# Patient Record
Sex: Male | Born: 1935 | Race: Black or African American | Hispanic: No | Marital: Married | Smoking: Former smoker
Health system: Southern US, Community
[De-identification: ages and names within clinical notes are randomized; demographics above are authoritative.]

## PROBLEM LIST (undated history)

## (undated) DIAGNOSIS — E039 Hypothyroidism, unspecified: Secondary | ICD-10-CM

## (undated) DIAGNOSIS — I10 Essential (primary) hypertension: Secondary | ICD-10-CM

## (undated) DIAGNOSIS — N189 Chronic kidney disease, unspecified: Secondary | ICD-10-CM

## (undated) DIAGNOSIS — E119 Type 2 diabetes mellitus without complications: Secondary | ICD-10-CM

## (undated) DIAGNOSIS — I509 Heart failure, unspecified: Secondary | ICD-10-CM

## (undated) HISTORY — PX: CHOLECYSTECTOMY: SHX55

## (undated) HISTORY — PX: CORONARY ARTERY BYPASS GRAFT: SHX141

## (undated) HISTORY — DX: Heart failure, unspecified: I50.9

## (undated) HISTORY — DX: Chronic kidney disease, unspecified: N18.9

## (undated) HISTORY — DX: Type 2 diabetes mellitus without complications: E11.9

## (undated) HISTORY — DX: Essential (primary) hypertension: I10

## (undated) HISTORY — DX: Hypothyroidism, unspecified: E03.9

---

## 2003-08-26 ENCOUNTER — Ambulatory Visit (HOSPITAL_COMMUNITY): Admission: RE | Admit: 2003-08-26 | Discharge: 2003-08-26 | Payer: Self-pay | Admitting: Ophthalmology

## 2005-04-23 IMAGING — CR DG CHEST 2V
2 series · 2 of 2 positions shown · non-contrast
Comparison: none

CLINICAL DATA: 70-year-old male ? preoperative respiratory evaluation for retinal detachment.  History of hypertension and CABG.  
 CHEST (TWO VIEWS) 08/26/03 AT 8888 HOURS

[view not recorded (1 of 2)]
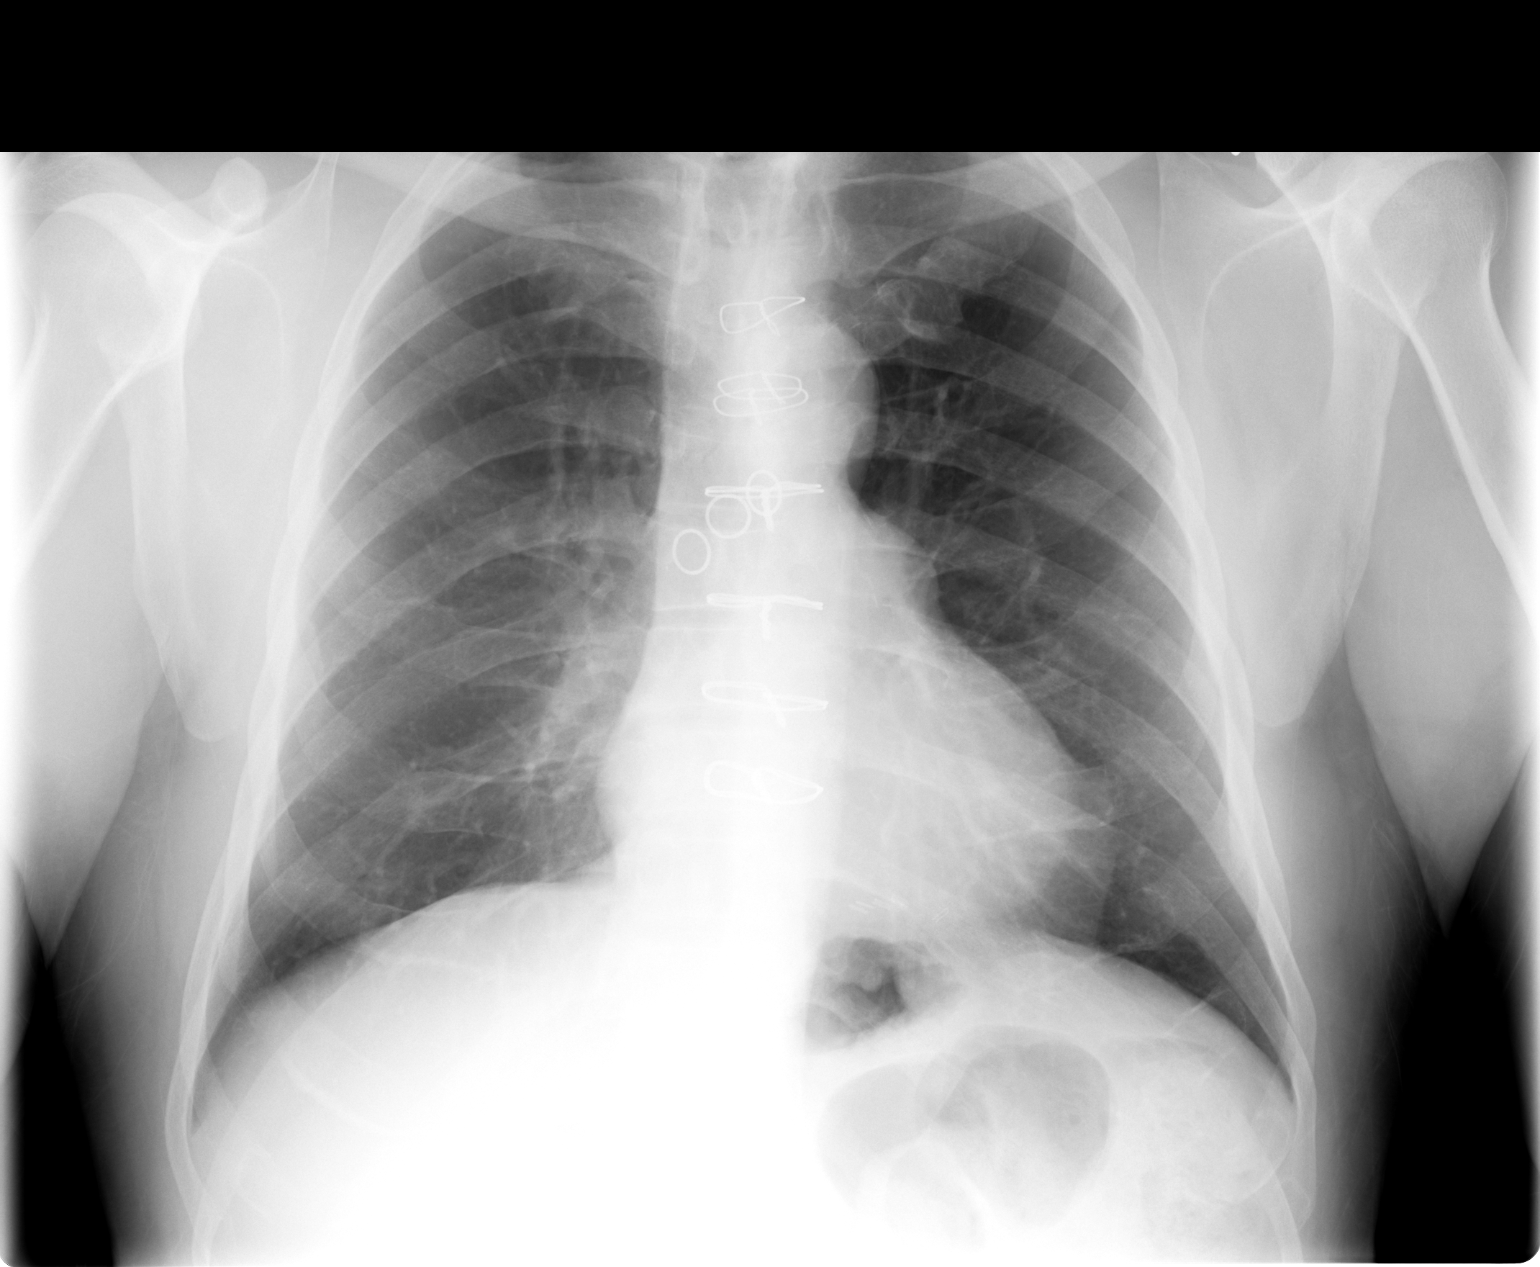

[view not recorded (2 of 2)]
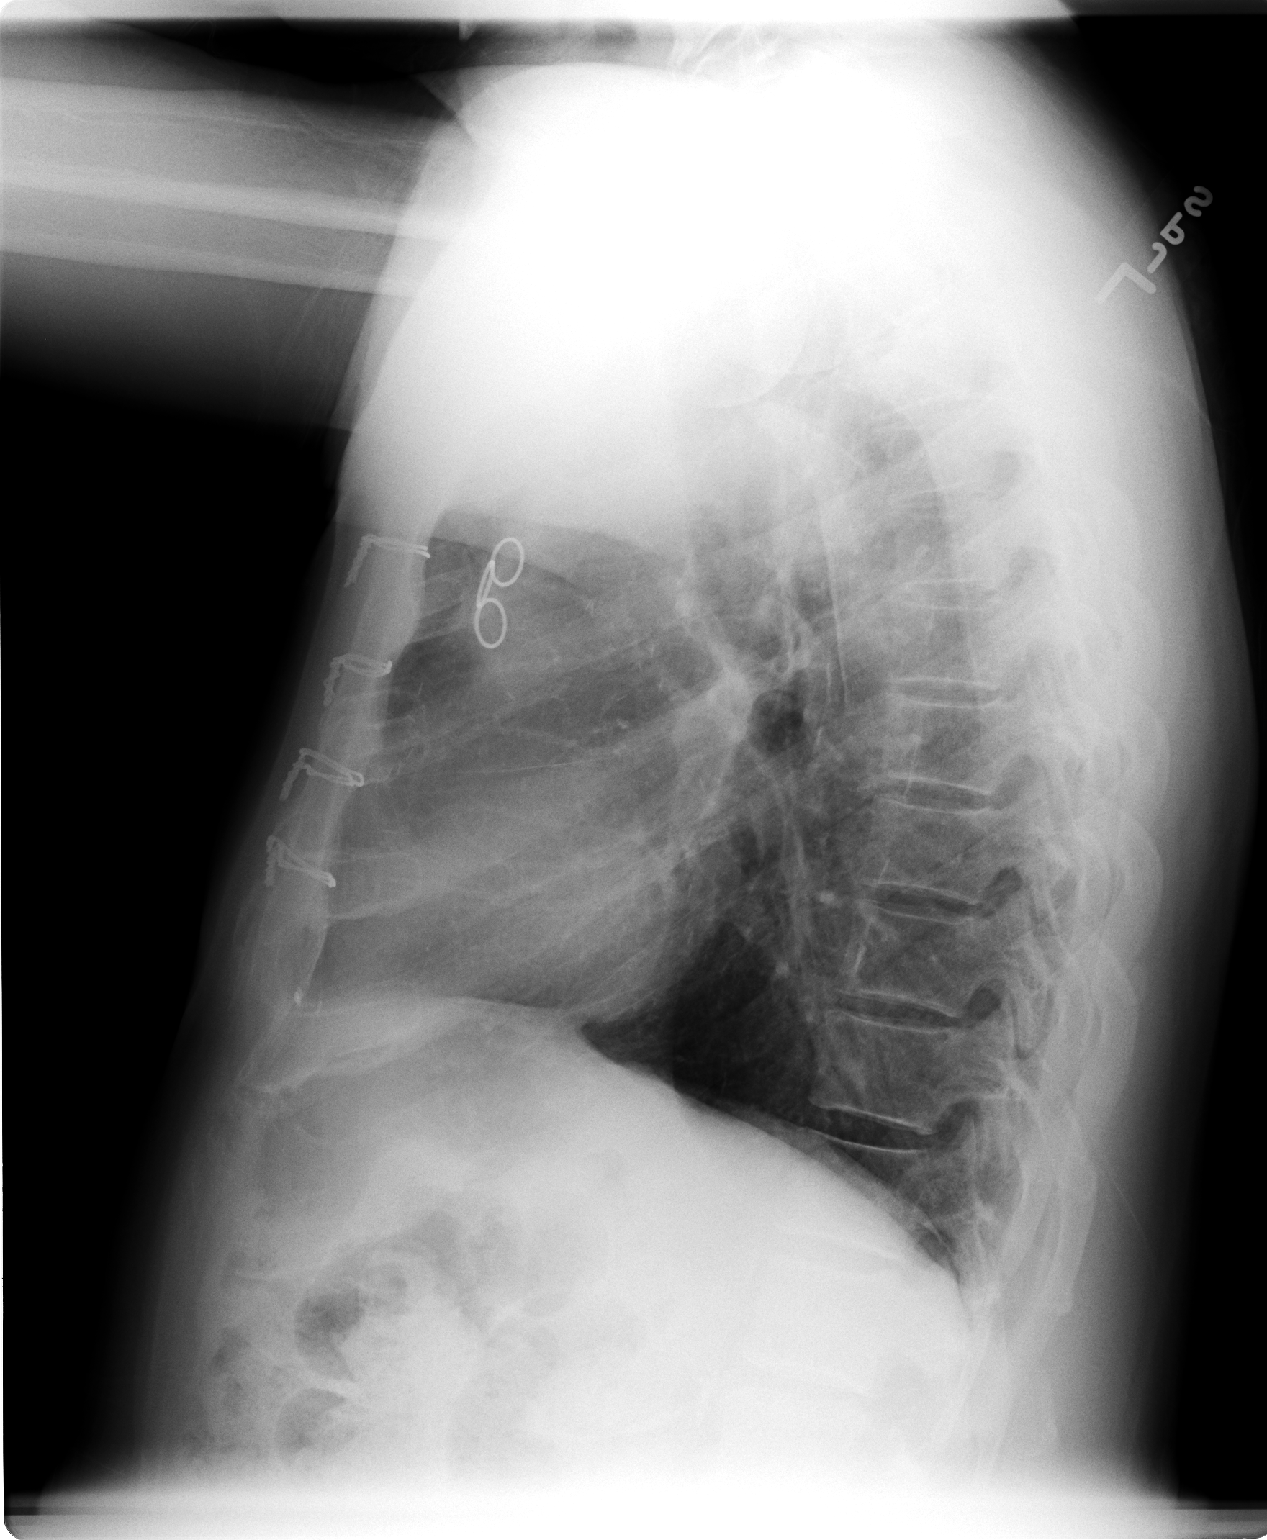

[2 of 2 positions shown; findings below may reference images not displayed]

FINDINGS: Heart size is normal.  There is prominence of the pulmonary outflow tract.  Patient has had a median sternotomy for CABG.  No active airspace disease.  Lungs are clear.  No effusion or pneumothorax.  
 IMPRESSION
 Status post CABG.  No active disease.

## 2015-01-23 ENCOUNTER — Ambulatory Visit: Payer: Self-pay | Admitting: "Endocrinology

## 2015-04-11 ENCOUNTER — Encounter: Payer: Self-pay | Admitting: "Endocrinology

## 2015-04-18 LAB — HEMOGLOBIN A1C: HEMOGLOBIN A1C: 7

## 2015-04-28 ENCOUNTER — Ambulatory Visit: Payer: Self-pay | Admitting: "Endocrinology

## 2015-05-19 ENCOUNTER — Ambulatory Visit (INDEPENDENT_AMBULATORY_CARE_PROVIDER_SITE_OTHER): Payer: Medicare Other | Admitting: "Endocrinology

## 2015-05-19 ENCOUNTER — Encounter: Payer: Self-pay | Admitting: "Endocrinology

## 2015-05-19 VITALS — BP 146/72 | HR 81 | Ht 66.0 in | Wt 147.0 lb

## 2015-05-19 DIAGNOSIS — Z992 Dependence on renal dialysis: Secondary | ICD-10-CM | POA: Diagnosis not present

## 2015-05-19 DIAGNOSIS — E785 Hyperlipidemia, unspecified: Secondary | ICD-10-CM

## 2015-05-19 DIAGNOSIS — I10 Essential (primary) hypertension: Secondary | ICD-10-CM | POA: Insufficient documentation

## 2015-05-19 DIAGNOSIS — E038 Other specified hypothyroidism: Secondary | ICD-10-CM | POA: Diagnosis not present

## 2015-05-19 DIAGNOSIS — E782 Mixed hyperlipidemia: Secondary | ICD-10-CM | POA: Insufficient documentation

## 2015-05-19 DIAGNOSIS — E1122 Type 2 diabetes mellitus with diabetic chronic kidney disease: Secondary | ICD-10-CM

## 2015-05-19 DIAGNOSIS — N186 End stage renal disease: Secondary | ICD-10-CM

## 2015-05-19 NOTE — Progress Notes (Signed)
Subjective:    Patient ID: Evan Carroll, male    DOB: 1934/01/04, PCP Olen Cordial, MD   Past Medical History  Diagnosis Date  . Hypothyroidism   . Diabetes mellitus, type II (HCC)   . CHF (congestive heart failure) (HCC)   . Hypertension   . CKD (chronic kidney disease)    Past Surgical History  Procedure Laterality Date  . Coronary artery bypass graft    . Cholecystectomy     Social History   Social History  . Marital Status: Married    Spouse Name: N/A  . Number of Children: N/A  . Years of Education: N/A   Social History Main Topics  . Smoking status: Former Games developer  . Smokeless tobacco: None  . Alcohol Use: No  . Drug Use: No  . Sexual Activity: Not Asked   Other Topics Concern  . None   Social History Narrative  . None   Outpatient Encounter Prescriptions as of 05/19/2015  Medication Sig  . amLODipine (NORVASC) 5 MG tablet Take 5 mg by mouth daily.  Marland Kitchen atorvastatin (LIPITOR) 20 MG tablet Take 20 mg by mouth daily.  . famotidine (PEPCID) 20 MG tablet Take 20 mg by mouth daily.  . hydrALAZINE (APRESOLINE) 50 MG tablet Take 50 mg by mouth 3 (three) times daily.  . Insulin Glargine (LANTUS SOLOSTAR) 100 UNIT/ML Solostar Pen Inject 10 Units into the skin every morning.  . labetalol (NORMODYNE) 200 MG tablet Take 200 mg by mouth once.  Marland Kitchen levothyroxine (SYNTHROID, LEVOTHROID) 75 MCG tablet Take 75 mcg by mouth daily before breakfast.  . nitroGLYCERIN (NITRODUR - DOSED IN MG/24 HR) 0.4 mg/hr patch Place 0.4 mg onto the skin daily.  . Prenat-FeFum-FePo-FA-Omega 3 (SE-TAN DHA PO) Take by mouth.  . sitaGLIPtin (JANUVIA) 25 MG tablet Take 25 mg by mouth daily.  . Vitamin D, Ergocalciferol, (DRISDOL) 50000 units CAPS capsule Take 50,000 Units by mouth every 7 (seven) days.  Marland Kitchen warfarin (COUMADIN) 7.5 MG tablet Take 7.5 mg by mouth daily.   No facility-administered encounter medications on file as of 05/19/2015.   ALLERGIES: No Known Allergies VACCINATION  STATUS:  There is no immunization history on file for this patient.  Diabetes He presents for his follow-up diabetic visit. He has type 2 diabetes mellitus. Onset time: He was diagnosed at approximate age of 52 years. His disease course has been improving. There are no hypoglycemic associated symptoms. Pertinent negatives for hypoglycemia include no confusion, headaches, pallor or seizures. There are no diabetic associated symptoms. Pertinent negatives for diabetes include no chest pain, no fatigue, no polydipsia, no polyphagia, no polyuria and no weakness. Symptoms are improving. Diabetic complications include heart disease, nephropathy and peripheral neuropathy. Risk factors for coronary artery disease include diabetes mellitus, dyslipidemia, hypertension, male sex, sedentary lifestyle and tobacco exposure. Current diabetic treatment includes insulin injections. He is compliant with treatment most of the time (He has a course of uncontrolled diabetes for several years in the past.). His weight is decreasing steadily. He is following a generally unhealthy diet. He never participates in exercise. His home blood glucose trend is decreasing steadily (He monitors once a day in the morning blood glucose readings or between 101 165.). His overall blood glucose range is 140-180 mg/dl.  Thyroid Problem Presents for follow-up visit. Patient reports no constipation, diarrhea, fatigue or palpitations. The symptoms have been improving. Past treatments include levothyroxine. His past medical history is significant for hyperlipidemia.  Hyperlipidemia This is a chronic problem. The current  episode started more than 1 year ago. Pertinent negatives include no chest pain, myalgias or shortness of breath. Current antihyperlipidemic treatment includes statins. Risk factors for coronary artery disease include diabetes mellitus, dyslipidemia, male sex and a sedentary lifestyle.  Hypertension This is a chronic problem. The  current episode started more than 1 year ago. Pertinent negatives include no chest pain, headaches, neck pain, palpitations or shortness of breath. Risk factors for coronary artery disease include dyslipidemia, diabetes mellitus, male gender and smoking/tobacco exposure. Hypertensive end-organ damage includes a thyroid problem.     Review of Systems  Constitutional: Negative for fever, chills, fatigue and unexpected weight change.  HENT: Negative for dental problem, mouth sores and trouble swallowing.   Eyes: Negative for visual disturbance.  Respiratory: Negative for cough, choking, chest tightness, shortness of breath and wheezing.   Cardiovascular: Negative for chest pain, palpitations and leg swelling.  Gastrointestinal: Negative for nausea, vomiting, abdominal pain, diarrhea, constipation and abdominal distention.  Endocrine: Negative for polydipsia, polyphagia and polyuria.  Genitourinary: Negative for dysuria, urgency, hematuria and flank pain.  Musculoskeletal: Negative for myalgias, back pain, gait problem and neck pain.  Skin: Negative for pallor, rash and wound.  Neurological: Negative for seizures, syncope, weakness, numbness and headaches.  Psychiatric/Behavioral: Negative.  Negative for confusion and dysphoric mood.    Objective:    BP 146/72 mmHg  Pulse 81  Ht  (1.676 m)  Wt 147 lb (66.679 kg)  BMI 23.74 kg/m2  SpO2 98%  Wt Readings from Last 3 Encounters:  05/19/15 147 lb (66.679 kg)    Physical Exam  Constitutional: He is oriented to person, place, and time. He appears well-developed and well-nourished. He is cooperative. No distress.  HENT:  Head: Normocephalic and atraumatic.  Eyes: EOM are normal.  Neck: Normal range of motion. Neck supple. No tracheal deviation present. No thyromegaly present.  Cardiovascular: Normal rate, S1 normal, S2 normal and normal heart sounds.  Exam reveals no gallop.   No murmur heard. Pulses:      Dorsalis pedis pulses are 1+  on the right side, and 1+ on the left side.       Posterior tibial pulses are 1+ on the right side, and 1+ on the left side.  Pulmonary/Chest: Breath sounds normal. No respiratory distress. He has no wheezes.  Abdominal: Soft. Bowel sounds are normal. He exhibits no distension. There is no tenderness. There is no guarding and no CVA tenderness.  Musculoskeletal: He exhibits no edema.       Right shoulder: He exhibits no swelling and no deformity.  Neurological: He is alert and oriented to person, place, and time. He has normal strength and normal reflexes. No cranial nerve deficit or sensory deficit. Gait normal.  Skin: Skin is warm and dry. No rash noted. No cyanosis. Nails show no clubbing.  Psychiatric: He has a normal mood and affect. His speech is normal and behavior is normal. Cognition and memory are normal.     Assessment & Plan:   1.  Type 2 Diabetes mellitus complicated by coronary artery disease and  end-stage renal disease (HCC) Now on hemodialysis  - patient remains at a high risk for more acute and chronic complications of diabetes which include CAD, CVA, CKD, retinopathy, and neuropathy. These are all discussed in detail with the patient.  Patient came with  controlled fasting glucose profile, and  recent A1c of  7 %.   Recent labs reviewed.   - I have re-counseled the patient on  diet management   by adopting a carbohydrate restricted / protein rich  Diet.  - Suggestion is made for patient to avoid simple carbohydrates   from their diet including Cakes , Desserts, Ice Cream,  Soda (  diet and regular) , Sweet Tea , Candies,  Chips, Cookies, Artificial Sweeteners,   and "Sugar-free" Products .  This will help patient to have stable blood glucose profile and potentially avoid unintended  Weight gain.  - Patient is advised to stick to a routine mealtimes to eat 3 meals  a day and avoid unnecessary snacks ( to snack only to correct hypoglycemia).  - The patient  has been   scheduled with Norm Salt, RDN, CDE for individualized DM education.  - I have approached patient with the following individualized plan to manage diabetes and patient agrees.  - I will proceed with basal insulin  Lantus 10 units QAM, associated with strict monitoring of glucose  AC and HS. - He will be reevaluated in one week to see if he needs more or less therapy. -Patient is encouraged to call clinic for blood glucose levels less than 70 or above 300 mg /dl. - I will continue  Januvia 25 mg by mouth every morning, therapeutically suitable for patient. -Patient is not a candidate for  metformin,SGLT2 inhibitors due to CKD.  - He is not a suitable candidate for incretin therapy . - Patient specific target  for A1c; LDL, HDL, Triglycerides, and  Waist Circumference were discussed in detail.  2) BP/HTN: controlled. Continue current medications. 3) Lipids/HPL:  continue statins. 4)  Weight/Diet: CDE consult in progress, exercise, and carbohydrates information provided.  5) hypothyroidism: I advised patient to continue levothyroxine 75 g by mouth every morning.  - We discussed about correct intake of levothyroxine, at fasting, with water, separated by at least 30 minutes from breakfast, and separated by more than 4 hours from calcium, iron, multivitamins, acid reflux medications (PPIs). -Patient is made aware of the fact that thyroid hormone replacement is needed for life, dose to be adjusted by periodic monitoring of thyroid function tests.  6) Chronic Care/Health Maintenance:  -Patient is  encouraged to continue to follow up with Ophthalmology, Podiatrist at least yearly or according to recommendations, and advised to  stay away from smoking. I have recommended yearly flu vaccine and pneumonia vaccination at least every 5 years;  and  sleep for at least 7 hours a day.  - 25 minutes of time was spent on the care of this patient , 50% of which was applied for counseling on diabetes  complications and their preventions.  - I advised patient to maintain close follow up with Olen Cordial, MD for primary care needs.  Patient is asked to bring meter and  blood glucose logs during their next visit.   Follow up plan: -Return in about 1 week (around 05/26/2015) for diabetes, high blood pressure, high cholesterol, underactive thyroid, follow up with meter and logs- no labs.  Marquis Lunch, MD Phone: [phone]  Fax: [phone]   05/19/2015, 3:33 PM

## 2015-06-02 ENCOUNTER — Ambulatory Visit: Payer: Medicare Other | Admitting: "Endocrinology

## 2015-06-12 ENCOUNTER — Ambulatory Visit (INDEPENDENT_AMBULATORY_CARE_PROVIDER_SITE_OTHER): Payer: Medicare Other | Admitting: "Endocrinology

## 2015-06-12 ENCOUNTER — Encounter: Payer: Self-pay | Admitting: "Endocrinology

## 2015-06-12 VITALS — BP 147/61 | HR 75 | Ht 66.0 in | Wt 144.0 lb

## 2015-06-12 DIAGNOSIS — E1122 Type 2 diabetes mellitus with diabetic chronic kidney disease: Secondary | ICD-10-CM

## 2015-06-12 DIAGNOSIS — N186 End stage renal disease: Secondary | ICD-10-CM | POA: Diagnosis not present

## 2015-06-12 DIAGNOSIS — E785 Hyperlipidemia, unspecified: Secondary | ICD-10-CM | POA: Diagnosis not present

## 2015-06-12 DIAGNOSIS — E038 Other specified hypothyroidism: Secondary | ICD-10-CM | POA: Diagnosis not present

## 2015-06-12 DIAGNOSIS — I10 Essential (primary) hypertension: Secondary | ICD-10-CM

## 2015-06-12 MED ORDER — ONETOUCH DELICA LANCETS 33G MISC: Status: AC

## 2015-06-12 NOTE — Patient Instructions (Signed)

## 2015-06-12 NOTE — Progress Notes (Signed)
Subjective:    Patient ID: Evan Carroll, male    DOB: 1933-10-14, PCP Olen Cordial, MD   Past Medical History  Diagnosis Date  . Hypothyroidism   . Diabetes mellitus, type II (HCC)   . CHF (congestive heart failure) (HCC)   . Hypertension   . CKD (chronic kidney disease)    Past Surgical History  Procedure Laterality Date  . Coronary artery bypass graft    . Cholecystectomy     Social History   Social History  . Marital Status: Married    Spouse Name: N/A  . Number of Children: N/A  . Years of Education: N/A   Social History Main Topics  . Smoking status: Former Games developer  . Smokeless tobacco: None  . Alcohol Use: No  . Drug Use: No  . Sexual Activity: Not Asked   Other Topics Concern  . None   Social History Narrative   Outpatient Encounter Prescriptions as of 06/12/2015  Medication Sig  . amLODipine (NORVASC) 5 MG tablet Take 5 mg by mouth daily.  Marland Kitchen atorvastatin (LIPITOR) 20 MG tablet Take 20 mg by mouth daily.  . famotidine (PEPCID) 20 MG tablet Take 20 mg by mouth daily.  . hydrALAZINE (APRESOLINE) 50 MG tablet Take 50 mg by mouth 3 (three) times daily.  . Insulin Glargine (LANTUS SOLOSTAR) 100 UNIT/ML Solostar Pen Inject 10 Units into the skin every morning.  . labetalol (NORMODYNE) 200 MG tablet Take 200 mg by mouth once.  Marland Kitchen levothyroxine (SYNTHROID, LEVOTHROID) 75 MCG tablet Take 75 mcg by mouth daily before breakfast.  . nitroGLYCERIN (NITRODUR - DOSED IN MG/24 HR) 0.4 mg/hr patch Place 0.4 mg onto the skin daily.  Letta Pate DELICA LANCETS 33G MISC Use to test glucose 2 times a day  . Prenat-FeFum-FePo-FA-Omega 3 (SE-TAN DHA PO) Take by mouth.  . sitaGLIPtin (JANUVIA) 25 MG tablet Take 25 mg by mouth daily.  . Vitamin D, Ergocalciferol, (DRISDOL) 50000 units CAPS capsule Take 50,000 Units by mouth every 7 (seven) days.  Marland Kitchen warfarin (COUMADIN) 7.5 MG tablet Take 7.5 mg by mouth daily.   No facility-administered encounter medications on file as of  06/12/2015.   ALLERGIES: No Known Allergies VACCINATION STATUS:  There is no immunization history on file for this patient.  Diabetes He presents for his follow-up diabetic visit. He has type 2 diabetes mellitus. Onset time: He was diagnosed at approximate age of 52 years. His disease course has been improving. There are no hypoglycemic associated symptoms. Pertinent negatives for hypoglycemia include no confusion, headaches, pallor or seizures. There are no diabetic associated symptoms. Pertinent negatives for diabetes include no chest pain, no fatigue, no polydipsia, no polyphagia, no polyuria and no weakness. Symptoms are improving. Diabetic complications include heart disease, nephropathy and peripheral neuropathy. Risk factors for coronary artery disease include diabetes mellitus, dyslipidemia, hypertension, male sex, sedentary lifestyle and tobacco exposure. Current diabetic treatment includes insulin injections. He is compliant with treatment most of the time (He has a course of uncontrolled diabetes for several years in the past.). His weight is decreasing steadily. He is following a generally unhealthy diet. He never participates in exercise. His home blood glucose trend is decreasing steadily (He monitors once a day in the morning blood glucose readings or between 101 165.). His breakfast blood glucose range is generally 140-180 mg/dl. His lunch blood glucose range is generally 140-180 mg/dl. His dinner blood glucose range is generally 140-180 mg/dl. His overall blood glucose range is 140-180 mg/dl.  Thyroid  Problem Presents for follow-up visit. Patient reports no constipation, diarrhea, fatigue or palpitations. The symptoms have been improving. Past treatments include levothyroxine. His past medical history is significant for hyperlipidemia.  Hyperlipidemia This is a chronic problem. The current episode started more than 1 year ago. Pertinent negatives include no chest pain, myalgias or  shortness of breath. Current antihyperlipidemic treatment includes statins. Risk factors for coronary artery disease include diabetes mellitus, dyslipidemia, male sex and a sedentary lifestyle.  Hypertension This is a chronic problem. The current episode started more than 1 year ago. Pertinent negatives include no chest pain, headaches, neck pain, palpitations or shortness of breath. Risk factors for coronary artery disease include dyslipidemia, diabetes mellitus, male gender and smoking/tobacco exposure. Hypertensive end-organ damage includes a thyroid problem.     Review of Systems  Constitutional: Negative for fever, chills, fatigue and unexpected weight change.  HENT: Negative for dental problem, mouth sores and trouble swallowing.   Eyes: Negative for visual disturbance.  Respiratory: Negative for cough, choking, chest tightness, shortness of breath and wheezing.   Cardiovascular: Negative for chest pain, palpitations and leg swelling.  Gastrointestinal: Negative for nausea, vomiting, abdominal pain, diarrhea, constipation and abdominal distention.  Endocrine: Negative for polydipsia, polyphagia and polyuria.  Genitourinary: Negative for dysuria, urgency, hematuria and flank pain.  Musculoskeletal: Negative for myalgias, back pain, gait problem and neck pain.  Skin: Negative for pallor, rash and wound.  Neurological: Negative for seizures, syncope, weakness, numbness and headaches.  Psychiatric/Behavioral: Negative.  Negative for confusion and dysphoric mood.    Objective:    BP 147/61 mmHg  Pulse 75  Ht  (1.676 m)  Wt 144 lb (65.318 kg)  BMI 23.25 kg/m2  SpO2 93%  Wt Readings from Last 3 Encounters:  06/12/15 144 lb (65.318 kg)  05/19/15 147 lb (66.679 kg)    Physical Exam  Constitutional: He is oriented to person, place, and time. He appears well-developed and well-nourished. He is cooperative. No distress.  HENT:  Head: Normocephalic and atraumatic.  Eyes: EOM are  normal.  Neck: Normal range of motion. Neck supple. No tracheal deviation present. No thyromegaly present.  Cardiovascular: Normal rate, S1 normal, S2 normal and normal heart sounds.  Exam reveals no gallop.   No murmur heard. Pulses:      Dorsalis pedis pulses are 1+ on the right side, and 1+ on the left side.       Posterior tibial pulses are 1+ on the right side, and 1+ on the left side.  Pulmonary/Chest: Breath sounds normal. No respiratory distress. He has no wheezes.  Abdominal: Soft. Bowel sounds are normal. He exhibits no distension. There is no tenderness. There is no guarding and no CVA tenderness.  Musculoskeletal: He exhibits no edema.       Right shoulder: He exhibits no swelling and no deformity.  Neurological: He is alert and oriented to person, place, and time. He has normal strength and normal reflexes. No cranial nerve deficit or sensory deficit. Gait normal.  Skin: Skin is warm and dry. No rash noted. No cyanosis. Nails show no clubbing.  Psychiatric: He has a normal mood and affect. His speech is normal and behavior is normal. Cognition and memory are normal.     Assessment & Plan:   1.  Type 2 Diabetes mellitus complicated by coronary artery disease and  end-stage renal disease (HCC) Now on hemodialysis  - patient remains at a high risk for more acute and chronic complications of diabetes which include CAD,  CVA, CKD, retinopathy, and neuropathy. These are all discussed in detail with the patient.  Patient came with  controlled fasting glucose profile, and  recent A1c of  7 %.   Recent labs reviewed.   - I have re-counseled the patient on diet management   by adopting a carbohydrate restricted / protein rich  Diet.  - Suggestion is made for patient to avoid simple carbohydrates   from their diet including Cakes , Desserts, Ice Cream,  Soda (  diet and regular) , Sweet Tea , Candies,  Chips, Cookies, Artificial Sweeteners,   and "Sugar-free" Products .  This will help  patient to have stable blood glucose profile and potentially avoid unintended  Weight gain.  - Patient is advised to stick to a routine mealtimes to eat 3 meals  a day and avoid unnecessary snacks ( to snack only to correct hypoglycemia).  - The patient  has been  scheduled with Norm Salt, RDN, CDE for individualized DM education.  - I have approached patient with the following individualized plan to manage diabetes and patient agrees.  - I will proceed with basal insulin  Lantus 10 units QAM, associated with monitoring of glucose  AC breakfast and at bedtime.  -Based on his current glucose profile he would not need prandial insulin for now. -Patient is encouraged to call clinic for blood glucose levels less than 70 or above 300 mg /dl. - I will continue  Januvia 25 mg by mouth every morning, therapeutically suitable for patient. -Patient is not a candidate for  metformin,SGLT2 inhibitors due to CKD.  - He is not a suitable candidate for incretin therapy . - Patient specific target  for A1c; LDL, HDL, Triglycerides, and  Waist Circumference were discussed in detail.  2) BP/HTN: controlled. Continue current medications. 3) Lipids/HPL:  continue statins. 4)  Weight/Diet: CDE consult in progress, exercise, and carbohydrates information provided.  5) hypothyroidism: I advised patient to continue levothyroxine 75 g by mouth every morning.  - We discussed about correct intake of levothyroxine, at fasting, with water, separated by at least 30 minutes from breakfast, and separated by more than 4 hours from calcium, iron, multivitamins, acid reflux medications (PPIs). -Patient is made aware of the fact that thyroid hormone replacement is needed for life, dose to be adjusted by periodic monitoring of thyroid function tests.  6) Chronic Care/Health Maintenance:  -Patient is  encouraged to continue to follow up with Ophthalmology, Podiatrist at least yearly or according to recommendations, and  advised to  stay away from smoking. I have recommended yearly flu vaccine and pneumonia vaccination at least every 5 years;  and  sleep for at least 7 hours a day.  - 25 minutes of time was spent on the care of this patient , 50% of which was applied for counseling on diabetes complications and their preventions.  - I advised patient to maintain close follow up with Olen Cordial, MD for primary care needs.  Patient is asked to bring meter and  blood glucose logs during their next visit.   Follow up plan: -Return in about 8 weeks (around 08/07/2015) for follow up with pre-visit labs, meter, and logs, diabetes, high blood pressure, high cholesterol, underactive thyroid.  Marquis Lunch, MD Phone: [phone]  Fax: [phone]   06/12/2015, 11:37 AM

## 2015-07-21 ENCOUNTER — Other Ambulatory Visit: Payer: Self-pay | Admitting: "Endocrinology

## 2015-07-30 LAB — HEMOGLOBIN A1C: HEMOGLOBIN A1C: 7.7

## 2015-08-12 ENCOUNTER — Ambulatory Visit (INDEPENDENT_AMBULATORY_CARE_PROVIDER_SITE_OTHER): Payer: Medicare Other | Admitting: "Endocrinology

## 2015-08-12 ENCOUNTER — Encounter: Payer: Self-pay | Admitting: "Endocrinology

## 2015-08-12 VITALS — BP 135/71 | HR 85 | Ht 66.0 in | Wt 145.0 lb

## 2015-08-12 DIAGNOSIS — E1122 Type 2 diabetes mellitus with diabetic chronic kidney disease: Secondary | ICD-10-CM

## 2015-08-12 DIAGNOSIS — I10 Essential (primary) hypertension: Secondary | ICD-10-CM | POA: Diagnosis not present

## 2015-08-12 DIAGNOSIS — E038 Other specified hypothyroidism: Secondary | ICD-10-CM

## 2015-08-12 DIAGNOSIS — N186 End stage renal disease: Secondary | ICD-10-CM | POA: Diagnosis not present

## 2015-08-12 DIAGNOSIS — E785 Hyperlipidemia, unspecified: Secondary | ICD-10-CM

## 2015-08-12 MED ORDER — L-METHYLFOLATE-B6-B12 3-35-2 MG PO TABS: 1.0000 | ORAL_TABLET | Freq: Every day | ORAL | Status: DC

## 2015-08-12 MED ORDER — LEVOTHYROXINE SODIUM 88 MCG PO TABS: 88.0000 ug | ORAL_TABLET | Freq: Every day | ORAL | Status: DC

## 2015-08-12 NOTE — Progress Notes (Signed)
Subjective:    Patient ID: Evan Carroll, male    DOB: 02-May-1933, PCP Olen Cordial, MD   Past Medical History  Diagnosis Date  . Hypothyroidism   . Diabetes mellitus, type II (HCC)   . CHF (congestive heart failure) (HCC)   . Hypertension   . CKD (chronic kidney disease)    Past Surgical History  Procedure Laterality Date  . Coronary artery bypass graft    . Cholecystectomy     Social History   Social History  . Marital Status: Married    Spouse Name: N/A  . Number of Children: N/A  . Years of Education: N/A   Social History Main Topics  . Smoking status: Former Games developer  . Smokeless tobacco: None  . Alcohol Use: No  . Drug Use: No  . Sexual Activity: Not Asked   Other Topics Concern  . None   Social History Narrative   Outpatient Encounter Prescriptions as of 08/12/2015  Medication Sig  . amLODipine (NORVASC) 5 MG tablet Take 5 mg by mouth daily.  Marland Kitchen atorvastatin (LIPITOR) 20 MG tablet Take 20 mg by mouth daily.  . famotidine (PEPCID) 20 MG tablet Take 20 mg by mouth daily.  . hydrALAZINE (APRESOLINE) 50 MG tablet Take 50 mg by mouth 3 (three) times daily.  . Insulin Glargine (LANTUS SOLOSTAR) 100 UNIT/ML Solostar Pen Inject 10 Units into the skin every morning.  Marland Kitchen JANUVIA 25 MG tablet TAKE 1 TABLET BY MOUTH EVERY DAY  . l-methylfolate-B6-B12 (METANX) 3-35-2 MG TABS tablet Take 1 tablet by mouth daily.  Marland Kitchen labetalol (NORMODYNE) 200 MG tablet Take 200 mg by mouth once.  Marland Kitchen levothyroxine (SYNTHROID, LEVOTHROID) 88 MCG tablet Take 1 tablet (88 mcg total) by mouth daily before breakfast.  . nitroGLYCERIN (NITRODUR - DOSED IN MG/24 HR) 0.4 mg/hr patch Place 0.4 mg onto the skin daily.  Letta Pate DELICA LANCETS 33G MISC Use to test glucose 2 times a day  . Prenat-FeFum-FePo-FA-Omega 3 (SE-TAN DHA PO) Take by mouth.  . Vitamin D, Ergocalciferol, (DRISDOL) 50000 units CAPS capsule Take 50,000 Units by mouth every 7 (seven) days.  Marland Kitchen warfarin (COUMADIN) 7.5 MG tablet Take  7.5 mg by mouth daily.  . [DISCONTINUED] levothyroxine (SYNTHROID, LEVOTHROID) 75 MCG tablet Take 75 mcg by mouth daily before breakfast.   No facility-administered encounter medications on file as of 08/12/2015.   ALLERGIES: No Known Allergies VACCINATION STATUS:  There is no immunization history on file for this patient.  Diabetes He presents for his follow-up diabetic visit. He has type 2 diabetes mellitus. Onset time: He was diagnosed at approximate age of 52 years. His disease course has been stable. There are no hypoglycemic associated symptoms. Pertinent negatives for hypoglycemia include no confusion, headaches, pallor or seizures. There are no diabetic associated symptoms. Pertinent negatives for diabetes include no chest pain, no fatigue, no polydipsia, no polyphagia, no polyuria and no weakness. Symptoms are stable. Diabetic complications include heart disease, nephropathy and peripheral neuropathy. Risk factors for coronary artery disease include diabetes mellitus, dyslipidemia, hypertension, male sex, sedentary lifestyle and tobacco exposure. Current diabetic treatment includes insulin injections. He is compliant with treatment most of the time (He has a course of uncontrolled diabetes for several years in the past.). His weight is stable. He is following a generally unhealthy diet. He never participates in exercise. His home blood glucose trend is decreasing steadily (He monitors once a day in the morning blood glucose readings or between 101 165.). His breakfast blood glucose  range is generally 90-110 mg/dl. His dinner blood glucose range is generally 140-180 mg/dl. His overall blood glucose range is 130-140 mg/dl.  Thyroid Problem Presents for follow-up visit. Patient reports no constipation, diarrhea, fatigue or palpitations. The symptoms have been improving. Past treatments include levothyroxine. His past medical history is significant for hyperlipidemia.  Hyperlipidemia This is a  chronic problem. The current episode started more than 1 year ago. Pertinent negatives include no chest pain, myalgias or shortness of breath. Current antihyperlipidemic treatment includes statins. Risk factors for coronary artery disease include diabetes mellitus, dyslipidemia, male sex and a sedentary lifestyle.  Hypertension This is a chronic problem. The current episode started more than 1 year ago. Pertinent negatives include no chest pain, headaches, neck pain, palpitations or shortness of breath. Risk factors for coronary artery disease include dyslipidemia, diabetes mellitus, male gender and smoking/tobacco exposure. Hypertensive end-organ damage includes a thyroid problem.     Review of Systems  Constitutional: Negative for fever, chills, fatigue and unexpected weight change.  HENT: Negative for dental problem, mouth sores and trouble swallowing.   Eyes: Negative for visual disturbance.  Respiratory: Negative for cough, choking, chest tightness, shortness of breath and wheezing.   Cardiovascular: Negative for chest pain, palpitations and leg swelling.  Gastrointestinal: Negative for nausea, vomiting, abdominal pain, diarrhea, constipation and abdominal distention.  Endocrine: Negative for polydipsia, polyphagia and polyuria.  Genitourinary: Negative for dysuria, urgency, hematuria and flank pain.  Musculoskeletal: Negative for myalgias, back pain, gait problem and neck pain.  Skin: Negative for pallor, rash and wound.  Neurological: Negative for seizures, syncope, weakness, numbness and headaches.  Psychiatric/Behavioral: Negative.  Negative for confusion and dysphoric mood.    Objective:    BP 135/71 mmHg  Pulse 85  Ht  (1.676 m)  Wt 145 lb (65.772 kg)  BMI 23.41 kg/m2  SpO2 96%  Wt Readings from Last 3 Encounters:  08/12/15 145 lb (65.772 kg)  06/12/15 144 lb (65.318 kg)  05/19/15 147 lb (66.679 kg)    Physical Exam  Constitutional: He is oriented to person, place,  and time. He appears well-developed and well-nourished. He is cooperative. No distress.  HENT:  Head: Normocephalic and atraumatic.  Eyes: EOM are normal.  Neck: Normal range of motion. Neck supple. No tracheal deviation present. No thyromegaly present.  Cardiovascular: Normal rate, S1 normal, S2 normal and normal heart sounds.  Exam reveals no gallop.   No murmur heard. Pulses:      Dorsalis pedis pulses are 1+ on the right side, and 1+ on the left side.       Posterior tibial pulses are 1+ on the right side, and 1+ on the left side.  Pulmonary/Chest: Breath sounds normal. No respiratory distress. He has no wheezes.  Abdominal: Soft. Bowel sounds are normal. He exhibits no distension. There is no tenderness. There is no guarding and no CVA tenderness.  Musculoskeletal: He exhibits no edema.       Right shoulder: He exhibits no swelling and no deformity.  Neurological: He is alert and oriented to person, place, and time. He has normal strength and normal reflexes. No cranial nerve deficit or sensory deficit. Gait normal.  Skin: Skin is warm and dry. No rash noted. No cyanosis. Nails show no clubbing.  Psychiatric: He has a normal mood and affect. His speech is normal and behavior is normal. Cognition and memory are normal.     Assessment & Plan:   1.  Type 2 Diabetes mellitus complicated by coronary  artery disease and  end-stage renal disease (HCC) Now on hemodialysis  - patient remains at a high risk for more acute and chronic complications of diabetes which include CAD, CVA, CKD, retinopathy, and neuropathy. These are all discussed in detail with the patient.  Patient came with  controlled fasting glucose profile, and  recent A1c of  7.7 %.   Recent labs reviewed.   - I have re-counseled the patient on diet management   by adopting a carbohydrate restricted / protein rich  Diet.  - Suggestion is made for patient to avoid simple carbohydrates   from their diet including Cakes ,  Desserts, Ice Cream,  Soda (  diet and regular) , Sweet Tea , Candies,  Chips, Cookies, Artificial Sweeteners,   and "Sugar-free" Products .  This will help patient to have stable blood glucose profile and potentially avoid unintended  Weight gain.  - Patient is advised to stick to a routine mealtimes to eat 3 meals  a day and avoid unnecessary snacks ( to snack only to correct hypoglycemia).  - The patient  has been  scheduled with Norm Salt, RDN, CDE for individualized DM education.  - I have approached patient with the following individualized plan to manage diabetes and patient agrees.  - I will proceed with basal insulin  Lantus 10 units QAM, associated with monitoring of glucose  AC breakfast and at bedtime.  -Based on his current glucose profile he would not need prandial insulin for now. -Patient is encouraged to call clinic for blood glucose levels less than 70 or above 300 mg /dl. - I will continue  Januvia 25 mg by mouth every morning, therapeutically suitable for patient. -Patient is not a candidate for  metformin,SGLT2 inhibitors due to CKD.  - He is not a suitable candidate for incretin therapy . - Patient specific target  for A1c; LDL, HDL, Triglycerides, and  Waist Circumference were discussed in detail.  2) BP/HTN: controlled. Continue current medications. 3) Lipids/HPL:  continue statins. 4)  Weight/Diet: CDE consult in progress, exercise, and carbohydrates information provided.  5) hypothyroidism:  -His thyroid function tests are consistent with unde replacement. I advised patient to increase levothyroxine to 88 g to take every morning.  - We discussed about correct intake of levothyroxine, at fasting, with water, separated by at least 30 minutes from breakfast, and separated by more than 4 hours from calcium, iron, multivitamins, acid reflux medications (PPIs). -Patient is made aware of the fact that thyroid hormone replacement is needed for life, dose to be adjusted  by periodic monitoring of thyroid function tests.  6) Chronic Care/Health Maintenance:  -Patient is  encouraged to continue to follow up with Ophthalmology, Podiatrist at least yearly or according to recommendations, and advised to  stay away from smoking. I have recommended yearly flu vaccine and pneumonia vaccination at least every 5 years;  and  sleep for at least 7 hours a day.  - 25 minutes of time was spent on the care of this patient , 50% of which was applied for counseling on diabetes complications and their preventions.  - I advised patient to maintain close follow up with Olen Cordial, MD for primary care needs.  Patient is asked to bring meter and  blood glucose logs during their next visit.   Follow up plan: -Return in about 3 months (around 11/11/2015) for diabetes, high blood pressure, high cholesterol, underactive thyroid, follow up with pre-visit labs, meter, and logs.  Marquis Lunch, MD Phone: [phone]  Fax: [phone]   08/12/2015, 11:39 AM

## 2015-08-19 ENCOUNTER — Encounter: Payer: Self-pay | Admitting: "Endocrinology

## 2015-09-24 ENCOUNTER — Telehealth: Payer: Self-pay

## 2015-09-24 NOTE — Telephone Encounter (Signed)
Pt notified and agrees. 

## 2015-09-24 NOTE — Telephone Encounter (Signed)
Pt states that the Januvia is too expensive at $150. He is requesting a different medication to replace this.

## 2015-09-24 NOTE — Telephone Encounter (Signed)
He can stay off of it. advise him to increase his Lantus to 15 units every morning with breakfast.

## 2015-10-17 ENCOUNTER — Other Ambulatory Visit: Payer: Self-pay

## 2015-10-17 MED ORDER — INSULIN GLARGINE 100 UNIT/ML SOLOSTAR PEN: 10.0000 [IU] | PEN_INJECTOR | Freq: Every morning | SUBCUTANEOUS | Status: DC

## 2015-11-13 ENCOUNTER — Ambulatory Visit: Payer: Medicare Other | Admitting: "Endocrinology

## 2015-11-18 ENCOUNTER — Ambulatory Visit (INDEPENDENT_AMBULATORY_CARE_PROVIDER_SITE_OTHER): Payer: Medicare Other | Admitting: "Endocrinology

## 2015-11-18 ENCOUNTER — Encounter: Payer: Self-pay | Admitting: "Endocrinology

## 2015-11-18 VITALS — BP 179/73 | HR 72 | Ht 66.0 in | Wt 158.0 lb

## 2015-11-18 DIAGNOSIS — E038 Other specified hypothyroidism: Secondary | ICD-10-CM | POA: Diagnosis not present

## 2015-11-18 DIAGNOSIS — E785 Hyperlipidemia, unspecified: Secondary | ICD-10-CM

## 2015-11-18 DIAGNOSIS — I739 Peripheral vascular disease, unspecified: Secondary | ICD-10-CM

## 2015-11-18 DIAGNOSIS — I10 Essential (primary) hypertension: Secondary | ICD-10-CM | POA: Diagnosis not present

## 2015-11-18 DIAGNOSIS — E1122 Type 2 diabetes mellitus with diabetic chronic kidney disease: Secondary | ICD-10-CM

## 2015-11-18 DIAGNOSIS — N186 End stage renal disease: Secondary | ICD-10-CM | POA: Diagnosis not present

## 2015-11-18 NOTE — Progress Notes (Signed)
Subjective:    Patient ID: Evan Carroll, male    DOB: 04-02-1934, PCP Olen Cordial, MD   Past Medical History:  Diagnosis Date  . CHF (congestive heart failure) (HCC)   . CKD (chronic kidney disease)   . Diabetes mellitus, type II (HCC)   . Hypertension   . Hypothyroidism    Past Surgical History:  Procedure Laterality Date  . CHOLECYSTECTOMY    . CORONARY ARTERY BYPASS GRAFT     Social History   Social History  . Marital status: Married    Spouse name: N/A  . Number of children: N/A  . Years of education: N/A   Social History Main Topics  . Smoking status: Former Games developer  . Smokeless tobacco: None  . Alcohol use No  . Drug use: No  . Sexual activity: Not Asked   Other Topics Concern  . None   Social History Narrative  . None   Outpatient Encounter Prescriptions as of 11/18/2015  Medication Sig  . amLODipine (NORVASC) 5 MG tablet Take 5 mg by mouth daily.  Marland Kitchen atorvastatin (LIPITOR) 20 MG tablet Take 20 mg by mouth daily.  . famotidine (PEPCID) 20 MG tablet Take 20 mg by mouth daily.  . hydrALAZINE (APRESOLINE) 50 MG tablet Take 50 mg by mouth 3 (three) times daily.  . Insulin Glargine (LANTUS SOLOSTAR) 100 UNIT/ML Solostar Pen Inject 10 Units into the skin every morning.  Marland Kitchen JANUVIA 25 MG tablet TAKE 1 TABLET BY MOUTH EVERY DAY  . l-methylfolate-B6-B12 (METANX) 3-35-2 MG TABS tablet Take 1 tablet by mouth daily.  Marland Kitchen labetalol (NORMODYNE) 200 MG tablet Take 200 mg by mouth once.  Marland Kitchen levothyroxine (SYNTHROID, LEVOTHROID) 88 MCG tablet Take 1 tablet (88 mcg total) by mouth daily before breakfast.  . nitroGLYCERIN (NITRODUR - DOSED IN MG/24 HR) 0.4 mg/hr patch Place 0.4 mg onto the skin daily.  Letta Pate DELICA LANCETS 33G MISC Use to test glucose 2 times a day  . Prenat-FeFum-FePo-FA-Omega 3 (SE-TAN DHA PO) Take by mouth.  . Vitamin D, Ergocalciferol, (DRISDOL) 50000 units CAPS capsule Take 50,000 Units by mouth every 7 (seven) days.  Marland Kitchen warfarin (COUMADIN) 7.5 MG  tablet Take 7.5 mg by mouth daily.   No facility-administered encounter medications on file as of 11/18/2015.    ALLERGIES: No Known Allergies VACCINATION STATUS:  There is no immunization history on file for this patient.  Diabetes  He presents for his follow-up diabetic visit. He has type 2 diabetes mellitus. Onset time: He was diagnosed at approximate age of 52 years. His disease course has been stable. There are no hypoglycemic associated symptoms. Pertinent negatives for hypoglycemia include no confusion, headaches, pallor or seizures. There are no diabetic associated symptoms. Pertinent negatives for diabetes include no chest pain, no fatigue, no polydipsia, no polyphagia, no polyuria and no weakness. Symptoms are stable. Diabetic complications include heart disease, nephropathy and peripheral neuropathy. Risk factors for coronary artery disease include diabetes mellitus, dyslipidemia, hypertension, male sex, sedentary lifestyle and tobacco exposure. Current diabetic treatment includes insulin injections. He is compliant with treatment most of the time (He has a course of uncontrolled diabetes for several years in the past.). His weight is stable. He is following a generally unhealthy diet. He never participates in exercise. His home blood glucose trend is decreasing steadily (He monitors once a day in the morning blood glucose readings or between 101 165.). His breakfast blood glucose range is generally 90-110 mg/dl. His dinner blood glucose range is generally  140-180 mg/dl. His overall blood glucose range is 130-140 mg/dl.  Thyroid Problem  Presents for follow-up visit. Patient reports no constipation, diarrhea, fatigue or palpitations. The symptoms have been improving. Past treatments include levothyroxine. His past medical history is significant for hyperlipidemia.  Hyperlipidemia  This is a chronic problem. The current episode started more than 1 year ago. Pertinent negatives include no chest  pain, myalgias or shortness of breath. Current antihyperlipidemic treatment includes statins. Risk factors for coronary artery disease include diabetes mellitus, dyslipidemia, male sex and a sedentary lifestyle.  Hypertension  This is a chronic problem. The current episode started more than 1 year ago. Pertinent negatives include no chest pain, headaches, neck pain, palpitations or shortness of breath. Risk factors for coronary artery disease include dyslipidemia, diabetes mellitus, male gender and smoking/tobacco exposure. Hypertensive end-organ damage includes a thyroid problem.     Review of Systems  Constitutional: Negative for chills, fatigue, fever and unexpected weight change.  HENT: Negative for dental problem, mouth sores and trouble swallowing.   Eyes: Negative for visual disturbance.  Respiratory: Negative for cough, choking, chest tightness, shortness of breath and wheezing.   Cardiovascular: Negative for chest pain, palpitations and leg swelling.  Gastrointestinal: Negative for abdominal distention, abdominal pain, constipation, diarrhea, nausea and vomiting.  Endocrine: Negative for polydipsia, polyphagia and polyuria.  Genitourinary: Negative for dysuria, flank pain, hematuria and urgency.  Musculoskeletal: Negative for back pain, gait problem, myalgias and neck pain.  Skin: Negative for pallor, rash and wound.  Neurological: Negative for seizures, syncope, weakness, numbness and headaches.  Psychiatric/Behavioral: Negative.  Negative for confusion and dysphoric mood.    Objective:    BP (!) 179/73   Pulse 72   Ht  (1.676 m)   Wt 158 lb (71.7 kg)   SpO2 95%   BMI 25.50 kg/m   Wt Readings from Last 3 Encounters:  11/18/15 158 lb (71.7 kg)  08/12/15 145 lb (65.8 kg)  06/12/15 144 lb (65.3 kg)    Physical Exam  Constitutional: He is oriented to person, place, and time. He appears well-developed and well-nourished. He is cooperative. No distress.  HENT:  Head:  Normocephalic and atraumatic.  Eyes: EOM are normal.  Neck: Normal range of motion. Neck supple. No tracheal deviation present. No thyromegaly present.  Cardiovascular: Normal rate, S1 normal, S2 normal and normal heart sounds.  Exam reveals no gallop.   No murmur heard. Pulses:      Dorsalis pedis pulses are 0 on the right side, and 0 on the left side.       Posterior tibial pulses are 0 on the right side, and 0 on the left side.  He has bilateral feet calluses . Current active ulcer.  Pulmonary/Chest: Breath sounds normal. No respiratory distress. He has no wheezes.  Abdominal: Soft. Bowel sounds are normal. He exhibits no distension. There is no tenderness. There is no guarding and no CVA tenderness.  Musculoskeletal: He exhibits no edema.       Right shoulder: He exhibits no swelling and no deformity.  Neurological: He is alert and oriented to person, place, and time. He has normal strength and normal reflexes. No cranial nerve deficit or sensory deficit. Gait normal.  Skin: Skin is warm and dry. No rash noted. No cyanosis. Nails show no clubbing.  Psychiatric: He has a normal mood and affect. His speech is normal and behavior is normal. Cognition and memory are normal.     Assessment & Plan:   1.  Type 2 Diabetes mellitus complicated by coronary artery disease , Peripheral arterial disease , and  end-stage renal disease (HCC) Now on hemodialysis  - patient remains at a high risk for more acute and chronic complications of diabetes which include CAD, CVA, CKD, retinopathy, and neuropathy. These are all discussed in detail with the patient.  Patient came with  controlled fasting glucose profile, and  recent A1c of  7.5 %.   Recent labs reviewed.   - I have re-counseled the patient on diet management   by adopting a carbohydrate restricted / protein rich  Diet.  - Suggestion is made for patient to avoid simple carbohydrates   from their diet including Cakes , Desserts, Ice Cream,   Soda (  diet and regular) , Sweet Tea , Candies,  Chips, Cookies, Artificial Sweeteners,   and "Sugar-free" Products .  This will help patient to have stable blood glucose profile and potentially avoid unintended  Weight gain.  - Patient is advised to stick to a routine mealtimes to eat 3 meals  a day and avoid unnecessary snacks ( to snack only to correct hypoglycemia).  - The patient  has been  scheduled with Norm Salt, RDN, CDE for individualized DM education.  - I have approached patient with the following individualized plan to manage diabetes and patient agrees.  - I will proceed with basal insulin  Lantus 10 units QAM, associated with monitoring of glucose  AC breakfast and at bedtime.  -Based on his current glucose profile he would not need prandial insulin for now. -Patient is encouraged to call clinic for blood glucose levels less than 70 or above 300 mg /dl. - I will continue  Januvia 25 mg by mouth every morning as long as he affords it, therapeutically suitable for patient. -Patient is not a candidate for  metformin,SGLT2 inhibitors due to CKD.  - He is not a suitable candidate for incretin therapy . - Patient specific target  for A1c; LDL, HDL, Triglycerides, and  Waist Circumference were discussed in detail.  2) BP/HTN: controlled. Continue current medications. 3) Lipids/HPL:  continue statins. 4)  Weight/Diet: CDE consult in progress, exercise, and carbohydrates information provided.   5) hypothyroidism:  -His thyroid function tests are consistent with unde replacement. I advised patient to increase levothyroxine to 88 g to take every morning.  - We discussed about correct intake of levothyroxine, at fasting, with water, separated by at least 30 minutes from breakfast, and separated by more than 4 hours from calcium, iron, multivitamins, acid reflux medications (PPIs). -Patient is made aware of the fact that thyroid hormone replacement is needed for life, dose to be  adjusted by periodic monitoring of thyroid function tests.  6) Chronic Care/Health Maintenance:  -Patient is  encouraged to continue to follow up with Ophthalmology, Podiatrist at least yearly or according to recommendations, and advised to  stay away from smoking. I have recommended yearly flu vaccine and pneumonia vaccination at least every 5 years;  and  sleep for at least 7 hours a day.  7) Peripheral Arterial disease Pt with hx of diabetic foot ulcer and current calluses on bilateral calluses. He used diabetic shoes before, he will benefit from continued foot care including with diabetic shoes .  - 25 minutes of time was spent on the care of this patient , 50% of which was applied for counseling on diabetes complications and their preventions.  - I advised patient to maintain close follow up with Olen Cordial, MD for  primary care needs.  Patient is asked to bring meter and  blood glucose logs during their next visit.   Follow up plan: -Return in about 3 months (around 02/18/2016) for follow up with meter and logs- no labs.  Marquis Lunch, MD Phone: [phone]  Fax: [phone]   11/18/2015, 11:28 AM

## 2015-12-12 ENCOUNTER — Encounter: Payer: Self-pay | Admitting: "Endocrinology

## 2015-12-25 ENCOUNTER — Other Ambulatory Visit: Payer: Self-pay | Admitting: "Endocrinology

## 2016-02-02 ENCOUNTER — Other Ambulatory Visit: Payer: Self-pay

## 2016-02-02 MED ORDER — LEVOTHYROXINE SODIUM 88 MCG PO TABS: ORAL_TABLET | ORAL | 1 refills | Status: DC

## 2016-02-03 ENCOUNTER — Other Ambulatory Visit: Payer: Self-pay

## 2016-02-03 MED ORDER — LEVOTHYROXINE SODIUM 88 MCG PO TABS: ORAL_TABLET | ORAL | 0 refills | Status: DC

## 2016-02-13 ENCOUNTER — Other Ambulatory Visit: Payer: Self-pay

## 2016-02-13 MED ORDER — GLUCOSE BLOOD VI STRP: ORAL_STRIP | 2 refills | Status: DC

## 2016-02-16 LAB — HEMOGLOBIN A1C: Hemoglobin A1C: 6.6

## 2016-02-26 ENCOUNTER — Encounter: Payer: Self-pay | Admitting: "Endocrinology

## 2016-02-26 ENCOUNTER — Ambulatory Visit (INDEPENDENT_AMBULATORY_CARE_PROVIDER_SITE_OTHER): Payer: Medicare Other | Admitting: "Endocrinology

## 2016-02-26 VITALS — BP 157/62 | HR 82 | Ht 66.0 in | Wt 155.0 lb

## 2016-02-26 DIAGNOSIS — N186 End stage renal disease: Secondary | ICD-10-CM | POA: Diagnosis not present

## 2016-02-26 DIAGNOSIS — E1122 Type 2 diabetes mellitus with diabetic chronic kidney disease: Secondary | ICD-10-CM

## 2016-02-26 DIAGNOSIS — E038 Other specified hypothyroidism: Secondary | ICD-10-CM | POA: Diagnosis not present

## 2016-02-26 DIAGNOSIS — I10 Essential (primary) hypertension: Secondary | ICD-10-CM

## 2016-02-26 NOTE — Progress Notes (Signed)
Subjective:    Patient ID: Evan Carroll, male    DOB: 05-22-1933, PCP Olen Cordial, MD   Past Medical History:  Diagnosis Date  . CHF (congestive heart failure) (HCC)   . CKD (chronic kidney disease)   . Diabetes mellitus, type II (HCC)   . Hypertension   . Hypothyroidism    Past Surgical History:  Procedure Laterality Date  . CHOLECYSTECTOMY    . CORONARY ARTERY BYPASS GRAFT     Social History   Social History  . Marital status: Married    Spouse name: N/A  . Number of children: N/A  . Years of education: N/A   Social History Main Topics  . Smoking status: Former Games developer  . Smokeless tobacco: Not on file  . Alcohol use No  . Drug use: No  . Sexual activity: Not on file   Other Topics Concern  . Not on file   Social History Narrative  . No narrative on file   Outpatient Encounter Prescriptions as of 02/26/2016  Medication Sig  . amLODipine (NORVASC) 5 MG tablet Take 5 mg by mouth daily.  Marland Kitchen atorvastatin (LIPITOR) 20 MG tablet Take 20 mg by mouth daily.  . busPIRone (BUSPAR) 5 MG tablet Take 5 mg by mouth 3 (three) times daily.  . famotidine (PEPCID) 20 MG tablet Take 20 mg by mouth daily.  . ferrous fumarate-iron polysaccharide complex (TANDEM) 162-115.2 MG CAPS capsule Take 1 capsule by mouth daily with breakfast.  . hydrALAZINE (APRESOLINE) 50 MG tablet Take 50 mg by mouth 3 (three) times daily.  . Insulin Glargine (LANTUS SOLOSTAR) 100 UNIT/ML Solostar Pen Inject 10 Units into the skin every morning.  Marland Kitchen l-methylfolate-B6-B12 (METANX) 3-35-2 MG TABS tablet Take 1 tablet by mouth daily.  Marland Kitchen labetalol (NORMODYNE) 200 MG tablet Take 200 mg by mouth once.  Marland Kitchen levothyroxine (SYNTHROID, LEVOTHROID) 88 MCG tablet TAKE 1 TABLET BY MOUTH DAILY BEFORE BREAKFAST  . nitroGLYCERIN (NITRODUR - DOSED IN MG/24 HR) 0.4 mg/hr patch Place 0.4 mg onto the skin daily.  . promethazine (PHENERGAN) 25 MG tablet Take 25 mg by mouth as needed for nausea or vomiting.  Marland Kitchen glucose blood  test strip Use as instructed bid. E11.65. One touch ultra  . JANUVIA 25 MG tablet TAKE 1 TABLET BY MOUTH EVERY DAY  . ONETOUCH DELICA LANCETS 33G MISC Use to test glucose 2 times a day  . [DISCONTINUED] Prenat-FeFum-FePo-FA-Omega 3 (SE-TAN DHA PO) Take by mouth.  . [DISCONTINUED] Vitamin D, Ergocalciferol, (DRISDOL) 50000 units CAPS capsule Take 50,000 Units by mouth every 7 (seven) days.  . [DISCONTINUED] warfarin (COUMADIN) 7.5 MG tablet Take 7.5 mg by mouth daily.   No facility-administered encounter medications on file as of 02/26/2016.    ALLERGIES: No Known Allergies VACCINATION STATUS:  There is no immunization history on file for this patient.  Diabetes  He presents for his follow-up diabetic visit. He has type 2 diabetes mellitus. Onset time: He was diagnosed at approximate age of 52 years. His disease course has been stable. There are no hypoglycemic associated symptoms. Pertinent negatives for hypoglycemia include no confusion, headaches, pallor or seizures. There are no diabetic associated symptoms. Pertinent negatives for diabetes include no chest pain, no fatigue, no polydipsia, no polyphagia, no polyuria and no weakness. Symptoms are stable. Diabetic complications include heart disease, nephropathy and peripheral neuropathy. Risk factors for coronary artery disease include diabetes mellitus, dyslipidemia, hypertension, male sex, sedentary lifestyle and tobacco exposure. Current diabetic treatment includes insulin injections. He is  compliant with treatment most of the time (He has a course of uncontrolled diabetes for several years in the past.). His weight is stable. He is following a generally unhealthy diet. He never participates in exercise. His home blood glucose trend is decreasing steadily (He monitors once a day in the morning blood glucose readings or between 101 165.). His breakfast blood glucose range is generally 90-110 mg/dl. His dinner blood glucose range is generally  140-180 mg/dl. His overall blood glucose range is 130-140 mg/dl.  Hypertension  This is a chronic problem. The current episode started more than 1 year ago. Pertinent negatives include no chest pain, headaches, neck pain, palpitations or shortness of breath. Risk factors for coronary artery disease include dyslipidemia, diabetes mellitus, male gender and smoking/tobacco exposure. Hypertensive end-organ damage includes a thyroid problem.  Hyperlipidemia  This is a chronic problem. The current episode started more than 1 year ago. Pertinent negatives include no chest pain, myalgias or shortness of breath. Current antihyperlipidemic treatment includes statins. Risk factors for coronary artery disease include diabetes mellitus, dyslipidemia, male sex and a sedentary lifestyle.  Thyroid Problem  Presents for follow-up visit. Patient reports no constipation, diarrhea, fatigue or palpitations. The symptoms have been improving. Past treatments include levothyroxine. His past medical history is significant for hyperlipidemia.     Review of Systems  Constitutional: Negative for chills, fatigue, fever and unexpected weight change.  HENT: Negative for dental problem, mouth sores and trouble swallowing.   Eyes: Negative for visual disturbance.  Respiratory: Negative for cough, choking, chest tightness, shortness of breath and wheezing.   Cardiovascular: Negative for chest pain, palpitations and leg swelling.  Gastrointestinal: Negative for abdominal distention, abdominal pain, constipation, diarrhea, nausea and vomiting.  Endocrine: Negative for polydipsia, polyphagia and polyuria.  Genitourinary: Negative for dysuria, flank pain, hematuria and urgency.  Musculoskeletal: Negative for back pain, gait problem, myalgias and neck pain.  Skin: Negative for pallor, rash and wound.  Neurological: Negative for seizures, syncope, weakness, numbness and headaches.  Psychiatric/Behavioral: Negative.  Negative for  confusion and dysphoric mood.    Objective:    BP (!) 157/62   Pulse 82   Ht 5\' 6"  (1.676 m)   Wt 155 lb (70.3 kg)   BMI 25.02 kg/m   Wt Readings from Last 3 Encounters:  02/26/16 155 lb (70.3 kg)  11/18/15 158 lb (71.7 kg)  08/12/15 145 lb (65.8 kg)    Physical Exam  Constitutional: He is oriented to person, place, and time. He appears well-developed and well-nourished. He is cooperative. No distress.  HENT:  Head: Normocephalic and atraumatic.  Eyes: EOM are normal.  Neck: Normal range of motion. Neck supple. No tracheal deviation present. No thyromegaly present.  Cardiovascular: Normal rate, S1 normal, S2 normal and normal heart sounds.  Exam reveals no gallop.   No murmur heard. Pulses:      Dorsalis pedis pulses are 0 on the right side, and 0 on the left side.       Posterior tibial pulses are 0 on the right side, and 0 on the left side.  He has bilateral feet calluses . Current active ulcer.  Pulmonary/Chest: Breath sounds normal. No respiratory distress. He has no wheezes.  Abdominal: Soft. Bowel sounds are normal. He exhibits no distension. There is no tenderness. There is no guarding and no CVA tenderness.  Musculoskeletal: He exhibits no edema.       Right shoulder: He exhibits no swelling and no deformity.  Neurological: He is alert and  oriented to person, place, and time. He has normal strength and normal reflexes. No cranial nerve deficit or sensory deficit. Gait normal.  Skin: Skin is warm and dry. No rash noted. No cyanosis. Nails show no clubbing.  Psychiatric: He has a normal mood and affect. His speech is normal and behavior is normal. Cognition and memory are normal.     Assessment & Plan:   1.  Type 2 Diabetes mellitus complicated by coronary artery disease , Peripheral arterial disease , and  end-stage renal disease (HCC) Now on hemodialysis  - patient remains at a high risk for more acute and chronic complications of diabetes which include CAD, CVA,  CKD, retinopathy, and neuropathy. These are all discussed in detail with the patient.  Patient came with  controlled fasting glucose profile, and  recent A1c of  6.6% improving from 7.7 %.   Recent labs reviewed.   - I have re-counseled the patient on diet management   by adopting a carbohydrate restricted / protein rich  Diet.  - Suggestion is made for patient to avoid simple carbohydrates   from their diet including Cakes , Desserts, Ice Cream,  Soda (  diet and regular) , Sweet Tea , Candies,  Chips, Cookies, Artificial Sweeteners,   and "Sugar-free" Products .  This will help patient to have stable blood glucose profile and potentially avoid unintended  Weight gain.  - Patient is advised to stick to a routine mealtimes to eat 3 meals  a day and avoid unnecessary snacks ( to snack only to correct hypoglycemia).  - The patient  has been  scheduled with Norm Salt, RDN, CDE for individualized DM education.  - I have approached patient with the following individualized plan to manage diabetes and patient agrees.  - I will proceed with basal insulin  Lantus 10 units QAM, associated with monitoring of glucose  AC breakfast and at bedtime.  -Based on his current glucose profile he would not need prandial insulin for now. -Patient is encouraged to call clinic for blood glucose levels less than 70 or above 300 mg /dl. - He did not afford Januvia hence discontinued. -Patient is not a candidate for  metformin,SGLT2 inhibitors due to CKD.  - He is not a suitable candidate for incretin therapy . - Patient specific target  for A1c; LDL, HDL, Triglycerides, and  Waist Circumference were discussed in detail.  2) BP/HTN: controlled. Continue current medications. 3) Lipids/HPL:  continue statins. 4)  Weight/Diet: CDE consult in progress, exercise, and carbohydrates information provided.   5) hypothyroidism:  -His labs from outside facility did not include thyroid function tests . I advised patient  to continue levothyroxine 88 g to take every morning.  - We discussed about correct intake of levothyroxine, at fasting, with water, separated by at least 30 minutes from breakfast, and separated by more than 4 hours from calcium, iron, multivitamins, acid reflux medications (PPIs). -Patient is made aware of the fact that thyroid hormone replacement is needed for life, dose to be adjusted by periodic monitoring of thyroid function tests.  6) Chronic Care/Health Maintenance:  -Patient is  encouraged to continue to follow up with Ophthalmology, Podiatrist at least yearly or according to recommendations, and advised to  stay away from smoking. I have recommended yearly flu vaccine and pneumonia vaccination at least every 5 years;  and  sleep for at least 7 hours a day.  7) Peripheral Arterial disease Pt with hx of diabetic foot ulcer and current calluses on  bilateral calluses. He used diabetic shoes before, he will benefit from continued foot care including with diabetic shoes .  - 25 minutes of time was spent on the care of this patient , 50% of which was applied for counseling on diabetes complications and their preventions.  - I advised patient to maintain close follow up with Olen CordialHARRIS,SYDNEY M, MD for primary care needs.  Patient is asked to bring meter and  blood glucose logs during their next visit.   Follow up plan: -Return in about 3 months (around 05/28/2016) for follow up with pre-visit labs, meter, and logs.  Marquis LunchGebre Saya Mccoll, MD Phone: [phone]  Fax: [phone]   02/26/2016, 11:27 AM

## 2016-04-17 ENCOUNTER — Other Ambulatory Visit: Payer: Self-pay | Admitting: "Endocrinology

## 2016-04-20 ENCOUNTER — Other Ambulatory Visit: Payer: Self-pay

## 2016-04-20 MED ORDER — L-METHYLFOLATE-B6-B12 3-35-2 MG PO TABS: 1.0000 | ORAL_TABLET | Freq: Every day | ORAL | 3 refills | Status: DC

## 2016-05-25 ENCOUNTER — Other Ambulatory Visit: Payer: Self-pay | Admitting: "Endocrinology

## 2016-05-28 ENCOUNTER — Encounter: Payer: Self-pay | Admitting: "Endocrinology

## 2016-05-28 ENCOUNTER — Other Ambulatory Visit: Payer: Self-pay | Admitting: "Endocrinology

## 2016-05-28 LAB — TSH: TSH: 2.95 (ref <=5.90)

## 2016-05-28 LAB — HEMOGLOBIN A1C: Hemoglobin A1C: 7

## 2016-06-01 ENCOUNTER — Encounter: Payer: Self-pay | Admitting: "Endocrinology

## 2016-06-01 ENCOUNTER — Ambulatory Visit (INDEPENDENT_AMBULATORY_CARE_PROVIDER_SITE_OTHER): Payer: Medicare Other | Admitting: "Endocrinology

## 2016-06-01 VITALS — BP 143/81 | HR 74 | Ht 66.0 in | Wt 158.0 lb

## 2016-06-01 DIAGNOSIS — N186 End stage renal disease: Secondary | ICD-10-CM

## 2016-06-01 DIAGNOSIS — E038 Other specified hypothyroidism: Secondary | ICD-10-CM

## 2016-06-01 DIAGNOSIS — E1122 Type 2 diabetes mellitus with diabetic chronic kidney disease: Secondary | ICD-10-CM | POA: Diagnosis not present

## 2016-06-01 DIAGNOSIS — I10 Essential (primary) hypertension: Secondary | ICD-10-CM | POA: Diagnosis not present

## 2016-06-01 DIAGNOSIS — E782 Mixed hyperlipidemia: Secondary | ICD-10-CM

## 2016-06-01 MED ORDER — INSULIN GLARGINE 100 UNIT/ML SOLOSTAR PEN: 15.0000 [IU] | PEN_INJECTOR | Freq: Every morning | SUBCUTANEOUS | 2 refills | Status: AC

## 2016-06-01 NOTE — Progress Notes (Signed)
Subjective:    Patient ID: Evan Carroll, male    DOB: August 04, 1933, PCP Olen Cordial, MD   Past Medical History:  Diagnosis Date  . CHF (congestive heart failure) (HCC)   . CKD (chronic kidney disease)   . Diabetes mellitus, type II (HCC)   . Hypertension   . Hypothyroidism    Past Surgical History:  Procedure Laterality Date  . CHOLECYSTECTOMY    . CORONARY ARTERY BYPASS GRAFT     Social History   Social History  . Marital status: Married    Spouse name: N/A  . Number of children: N/A  . Years of education: N/A   Social History Main Topics  . Smoking status: Former Games developer  . Smokeless tobacco: Never Used  . Alcohol use No  . Drug use: No  . Sexual activity: Not Asked   Other Topics Concern  . None   Social History Narrative  . None   Outpatient Encounter Prescriptions as of 06/01/2016  Medication Sig  . amLODipine (NORVASC) 5 MG tablet Take 5 mg by mouth daily.  Marland Kitchen atorvastatin (LIPITOR) 20 MG tablet Take 20 mg by mouth daily.  . busPIRone (BUSPAR) 5 MG tablet Take 5 mg by mouth 3 (three) times daily.  . famotidine (PEPCID) 20 MG tablet Take 20 mg by mouth daily.  . ferrous fumarate-iron polysaccharide complex (TANDEM) 162-115.2 MG CAPS capsule Take 1 capsule by mouth daily with breakfast.  . hydrALAZINE (APRESOLINE) 50 MG tablet Take 50 mg by mouth 3 (three) times daily.  . Insulin Glargine (LANTUS SOLOSTAR) 100 UNIT/ML Solostar Pen Inject 15 Units into the skin every morning.  Marland Kitchen l-methylfolate-B6-B12 (METANX) 3-35-2 MG TABS tablet Take 1 tablet by mouth daily.  Marland Kitchen labetalol (NORMODYNE) 200 MG tablet Take 200 mg by mouth once.  Marland Kitchen levothyroxine (SYNTHROID, LEVOTHROID) 88 MCG tablet TAKE 1 TABLET BY MOUTH DAILY BEFORE BREAKFAST  . promethazine (PHENERGAN) 25 MG tablet Take 25 mg by mouth as needed for nausea or vomiting.  . [DISCONTINUED] Insulin Glargine (LANTUS SOLOSTAR) 100 UNIT/ML Solostar Pen Inject 10 Units into the skin every morning.  . B-D ULTRAFINE III  SHORT PEN 31G X 8 MM MISC USE AS DIRECTED 4 TIMES A DAY  . glucose blood test strip Use as instructed bid. E11.65. One touch ultra  . nitroGLYCERIN (NITRODUR - DOSED IN MG/24 HR) 0.4 mg/hr patch Place 0.4 mg onto the skin daily.  Letta Pate DELICA LANCETS 33G MISC Use to test glucose 2 times a day  . [DISCONTINUED] JANUVIA 25 MG tablet TAKE 1 TABLET BY MOUTH EVERY DAY   No facility-administered encounter medications on file as of 06/01/2016.    ALLERGIES: No Known Allergies VACCINATION STATUS:  There is no immunization history on file for this patient.  Diabetes  He presents for his follow-up diabetic visit. He has type 2 diabetes mellitus. Onset time: He was diagnosed at approximate age of 52 years. His disease course has been stable. There are no hypoglycemic associated symptoms. Pertinent negatives for hypoglycemia include no confusion, headaches, pallor or seizures. There are no diabetic associated symptoms. Pertinent negatives for diabetes include no chest pain, no fatigue, no polydipsia, no polyphagia, no polyuria and no weakness. Symptoms are stable. Diabetic complications include heart disease, nephropathy and peripheral neuropathy. Risk factors for coronary artery disease include diabetes mellitus, dyslipidemia, hypertension, male sex, sedentary lifestyle and tobacco exposure. Current diabetic treatment includes insulin injections. He is compliant with treatment most of the time (He has a course of uncontrolled  diabetes for several years in the past.). His weight is stable. He is following a generally unhealthy diet. He never participates in exercise. His home blood glucose trend is decreasing steadily (He monitors once a day in the morning blood glucose readings or between 101 165.). His breakfast blood glucose range is generally 90-110 mg/dl. His overall blood glucose range is 180-200 mg/dl.  Hypertension  This is a chronic problem. The current episode started more than 1 year ago.  Pertinent negatives include no chest pain, headaches, neck pain, palpitations or shortness of breath. Risk factors for coronary artery disease include dyslipidemia, diabetes mellitus, male gender and smoking/tobacco exposure. Identifiable causes of hypertension include a thyroid problem.  Hyperlipidemia  This is a chronic problem. The current episode started more than 1 year ago. Pertinent negatives include no chest pain, myalgias or shortness of breath. Current antihyperlipidemic treatment includes statins. Risk factors for coronary artery disease include diabetes mellitus, dyslipidemia, male sex and a sedentary lifestyle.  Thyroid Problem  Presents for follow-up visit. Patient reports no constipation, diarrhea, fatigue or palpitations. The symptoms have been improving. Past treatments include levothyroxine. His past medical history is significant for hyperlipidemia.     Review of Systems  Constitutional: Negative for chills, fatigue, fever and unexpected weight change.  HENT: Negative for dental problem, mouth sores and trouble swallowing.   Eyes: Negative for visual disturbance.  Respiratory: Negative for cough, choking, chest tightness, shortness of breath and wheezing.   Cardiovascular: Negative for chest pain, palpitations and leg swelling.  Gastrointestinal: Negative for abdominal distention, abdominal pain, constipation, diarrhea, nausea and vomiting.  Endocrine: Negative for polydipsia, polyphagia and polyuria.  Genitourinary: Negative for dysuria, flank pain, hematuria and urgency.  Musculoskeletal: Negative for back pain, gait problem, myalgias and neck pain.  Skin: Negative for pallor, rash and wound.  Neurological: Negative for seizures, syncope, weakness, numbness and headaches.  Psychiatric/Behavioral: Negative.  Negative for confusion and dysphoric mood.    Objective:    BP (!) 143/81   Pulse 74   Ht 5\' 6"  (1.676 m)   Wt 158 lb (71.7 kg)   BMI 25.50 kg/m   Wt Readings  from Last 3 Encounters:  06/01/16 158 lb (71.7 kg)  02/26/16 155 lb (70.3 kg)  11/18/15 158 lb (71.7 kg)    Physical Exam  Constitutional: He is oriented to person, place, and time. He appears well-developed and well-nourished. He is cooperative. No distress.  HENT:  Head: Normocephalic and atraumatic.  Eyes: EOM are normal.  Neck: Normal range of motion. Neck supple. No tracheal deviation present. No thyromegaly present.  Cardiovascular: Normal rate, S1 normal, S2 normal and normal heart sounds.  Exam reveals no gallop.   No murmur heard. Pulses:      Dorsalis pedis pulses are 0 on the right side, and 0 on the left side.       Posterior tibial pulses are 0 on the right side, and 0 on the left side.  He has bilateral feet calluses . Current active ulcer.  Pulmonary/Chest: Breath sounds normal. No respiratory distress. He has no wheezes.  Abdominal: Soft. Bowel sounds are normal. He exhibits no distension. There is no tenderness. There is no guarding and no CVA tenderness.  Musculoskeletal: He exhibits no edema.       Right shoulder: He exhibits no swelling and no deformity.  Neurological: He is alert and oriented to person, place, and time. He has normal strength and normal reflexes. No cranial nerve deficit or sensory deficit. Gait  normal.  Skin: Skin is warm and dry. No rash noted. No cyanosis. Nails show no clubbing.  Psychiatric: He has a normal mood and affect. His speech is normal and behavior is normal. Cognition and memory are normal.    05/28/2016 his A1c was 7% him a free T4 1.09, TSH 2.95  Assessment & Plan:   1.  Type 2 Diabetes mellitus complicated by coronary artery disease , Peripheral arterial disease , and  end-stage renal disease (HCC) Now on hemodialysis  - patient remains at a high risk for more acute and chronic complications of diabetes which include CAD, CVA, CKD, retinopathy, and neuropathy. These are all discussed in detail with the patient.  Patient came  with  controlled fasting glucose profile, and  recent A1c of  7% , up from  6.6%  last visit.   Recent labs reviewed.   - I have re-counseled the patient on diet management   by adopting a carbohydrate restricted / protein rich  Diet.  - Suggestion is made for patient to avoid simple carbohydrates   from their diet including Cakes , Desserts, Ice Cream,  Soda (  diet and regular) , Sweet Tea , Candies,  Chips, Cookies, Artificial Sweeteners,   and "Sugar-free" Products .  This will help patient to have stable blood glucose profile and potentially avoid unintended  Weight gain.  - Patient is advised to stick to a routine mealtimes to eat 3 meals  a day and avoid unnecessary snacks ( to snack only to correct hypoglycemia).  - The patient  has been  scheduled with Norm SaltPenny Crumpton, RDN, CDE for individualized DM education.  - I have approached patient with the following individualized plan to manage diabetes and patient agrees.  - I will increase basal insulin  Lantus to 15 units QAM, associated with monitoring of glucose  AC breakfast and at bedtime.  -Based on his current glucose profile he would not need prandial insulin for now. -Patient is encouraged to call clinic for blood glucose levels less than 70 or above 300 mg /dl. - He did not afford Januvia hence discontinued. -Patient is not a candidate for  metformin,SGLT2 inhibitors due to CKD.  - He is not a suitable candidate for incretin therapy . - Patient specific target  for A1c; LDL, HDL, Triglycerides, and  Waist Circumference were discussed in detail.  2) BP/HTN: controlled. Continue current medications. 3) Lipids/HPL:  continue statins. 4)  Weight/Diet: CDE consult in progress, exercise, and carbohydrates information provided.   5) hypothyroidism:  - He is thyroid function tests are consistent with appropriate replacement. I advised patient to continue levothyroxine 88 g to take every morning.  - We discussed about correct intake  of levothyroxine, at fasting, with water, separated by at least 30 minutes from breakfast, and separated by more than 4 hours from calcium, iron, multivitamins, acid reflux medications (PPIs). -Patient is made aware of the fact that thyroid hormone replacement is needed for life, dose to be adjusted by periodic monitoring of thyroid function tests.  6) Chronic Care/Health Maintenance:  -Patient is  encouraged to continue to follow up with Ophthalmology, Podiatrist at least yearly or according to recommendations, and advised to  stay away from smoking. I have recommended yearly flu vaccine and pneumonia vaccination at least every 5 years;  and  sleep for at least 7 hours a day.  7) Peripheral Arterial disease Pt with hx of diabetic foot ulcer and current calluses on bilateral calluses. He used diabetic shoes  before, he will benefit from continued foot care including with diabetic shoes .  - 25 minutes of time was spent on the care of this patient , 50% of which was applied for counseling on diabetes complications and their preventions.  - I advised patient to maintain close follow up with Olen Cordial, MD for primary care needs.  Patient is asked to bring meter and  blood glucose logs during their next visit.   Follow up plan: -Return in about 3 months (around 08/29/2016) for follow up with pre-visit labs, meter, and logs.  Marquis Lunch, MD Phone: [phone]  Fax: [phone]   06/01/2016, 10:47 AM

## 2016-08-10 ENCOUNTER — Other Ambulatory Visit: Payer: Self-pay | Admitting: "Endocrinology

## 2016-08-31 ENCOUNTER — Ambulatory Visit: Payer: Medicare Other | Admitting: "Endocrinology

## 2016-09-16 ENCOUNTER — Other Ambulatory Visit: Payer: Self-pay

## 2016-09-16 MED ORDER — GLUCOSE BLOOD VI STRP: ORAL_STRIP | 5 refills | Status: AC

## 2016-09-30 LAB — BASIC METABOLIC PANEL
BUN: 26 — AB (ref 4–21)
Creatinine: 5.2 — AB (ref <=1.3)

## 2016-09-30 LAB — HEMOGLOBIN A1C: Hemoglobin A1C: 7.1

## 2016-10-05 ENCOUNTER — Ambulatory Visit: Payer: Medicare Other | Admitting: "Endocrinology

## 2016-10-09 ENCOUNTER — Other Ambulatory Visit: Payer: Self-pay | Admitting: "Endocrinology

## 2016-11-25 ENCOUNTER — Ambulatory Visit (INDEPENDENT_AMBULATORY_CARE_PROVIDER_SITE_OTHER): Payer: Medicare Other | Admitting: "Endocrinology

## 2016-11-25 ENCOUNTER — Encounter: Payer: Self-pay | Admitting: "Endocrinology

## 2016-11-25 VITALS — BP 156/62 | HR 73 | Wt 141.0 lb

## 2016-11-25 DIAGNOSIS — I10 Essential (primary) hypertension: Secondary | ICD-10-CM

## 2016-11-25 DIAGNOSIS — E782 Mixed hyperlipidemia: Secondary | ICD-10-CM | POA: Diagnosis not present

## 2016-11-25 DIAGNOSIS — N186 End stage renal disease: Secondary | ICD-10-CM

## 2016-11-25 DIAGNOSIS — E1122 Type 2 diabetes mellitus with diabetic chronic kidney disease: Secondary | ICD-10-CM

## 2016-11-25 DIAGNOSIS — E038 Other specified hypothyroidism: Secondary | ICD-10-CM

## 2016-11-25 NOTE — Progress Notes (Signed)
Subjective:    Patient ID: Evan Carroll, male    DOB: 02/27/1934, PCP Olen Cordial, MD   Past Medical History:  Diagnosis Date  . CHF (congestive heart failure) (HCC)   . CKD (chronic kidney disease)   . Diabetes mellitus, type II (HCC)   . Hypertension   . Hypothyroidism    Past Surgical History:  Procedure Laterality Date  . CHOLECYSTECTOMY    . CORONARY ARTERY BYPASS GRAFT     Social History   Social History  . Marital status: Married    Spouse name: N/A  . Number of children: N/A  . Years of education: N/A   Social History Main Topics  . Smoking status: Former Games developer  . Smokeless tobacco: Never Used  . Alcohol use No  . Drug use: No  . Sexual activity: Not Asked   Other Topics Concern  . None   Social History Narrative  . None   Outpatient Encounter Prescriptions as of 11/25/2016  Medication Sig  . amLODipine (NORVASC) 5 MG tablet Take 5 mg by mouth daily.  Marland Kitchen atorvastatin (LIPITOR) 20 MG tablet Take 20 mg by mouth daily.  . B-D ULTRAFINE III SHORT PEN 31G X 8 MM MISC USE AS DIRECTED 4 TIMES A DAY  . busPIRone (BUSPAR) 5 MG tablet Take 5 mg by mouth 3 (three) times daily.  . ferrous fumarate-iron polysaccharide complex (TANDEM) 162-115.2 MG CAPS capsule Take 1 capsule by mouth daily with breakfast.  . glucose blood (ONE TOUCH ULTRA TEST) test strip TEST AS DIRECTED TWICE A DAY. E11.65  . hydrALAZINE (APRESOLINE) 50 MG tablet Take 50 mg by mouth 3 (three) times daily.  . Insulin Glargine (LANTUS SOLOSTAR) 100 UNIT/ML Solostar Pen Inject 15 Units into the skin every morning.  Marland Kitchen l-methylfolate-B6-B12 (METANX) 3-35-2 MG TABS tablet Take 1 tablet by mouth daily.  Marland Kitchen labetalol (NORMODYNE) 200 MG tablet Take 200 mg by mouth once.  Marland Kitchen levothyroxine (SYNTHROID, LEVOTHROID) 88 MCG tablet TAKE 1 TABLET BY MOUTH DAILY BEFORE BREAKFAST  . nitroGLYCERIN (NITRODUR - DOSED IN MG/24 HR) 0.4 mg/hr patch Place 0.4 mg onto the skin daily.  Letta Pate DELICA LANCETS 33G MISC Use  to test glucose 2 times a day  . [DISCONTINUED] famotidine (PEPCID) 20 MG tablet Take 20 mg by mouth daily.  . [DISCONTINUED] promethazine (PHENERGAN) 25 MG tablet Take 25 mg by mouth as needed for nausea or vomiting.   No facility-administered encounter medications on file as of 11/25/2016.    ALLERGIES: No Known Allergies VACCINATION STATUS:  There is no immunization history on file for this patient.  Diabetes  He presents for his follow-up diabetic visit. He has type 2 diabetes mellitus. Onset time: He was diagnosed at approximate age of 52 years. His disease course has been stable. There are no hypoglycemic associated symptoms. Pertinent negatives for hypoglycemia include no confusion, headaches, pallor or seizures. There are no diabetic associated symptoms. Pertinent negatives for diabetes include no chest pain, no fatigue, no polydipsia, no polyphagia, no polyuria and no weakness. Symptoms are stable. Diabetic complications include heart disease, nephropathy and peripheral neuropathy. Risk factors for coronary artery disease include diabetes mellitus, dyslipidemia, hypertension, male sex, sedentary lifestyle and tobacco exposure. Current diabetic treatment includes insulin injections. He is compliant with treatment most of the time (He has a course of uncontrolled diabetes for several years in the past.). His weight is decreasing steadily. He is following a generally unhealthy diet. He never participates in exercise. His home blood  glucose trend is decreasing steadily (He monitors once a day in the morning blood glucose readings or between 101 165.). His breakfast blood glucose range is generally 90-110 mg/dl. His lunch blood glucose range is generally 140-180 mg/dl. His bedtime blood glucose range is generally 140-180 mg/dl. His overall blood glucose range is 140-180 mg/dl.  Hypertension  This is a chronic problem. The current episode started more than 1 year ago. Pertinent negatives include no  chest pain, headaches, neck pain, palpitations or shortness of breath. Risk factors for coronary artery disease include dyslipidemia, diabetes mellitus, male gender and smoking/tobacco exposure. Identifiable causes of hypertension include a thyroid problem.  Hyperlipidemia  This is a chronic problem. The current episode started more than 1 year ago. Pertinent negatives include no chest pain, myalgias or shortness of breath. Current antihyperlipidemic treatment includes statins. Risk factors for coronary artery disease include diabetes mellitus, dyslipidemia, male sex and a sedentary lifestyle.  Thyroid Problem  Presents for follow-up visit. Patient reports no constipation, diarrhea, fatigue or palpitations. The symptoms have been improving. Past treatments include levothyroxine. His past medical history is significant for hyperlipidemia.     Review of Systems  Constitutional: Negative for chills, fatigue, fever and unexpected weight change.  HENT: Negative for dental problem, mouth sores and trouble swallowing.   Eyes: Negative for visual disturbance.  Respiratory: Negative for cough, choking, chest tightness, shortness of breath and wheezing.   Cardiovascular: Negative for chest pain, palpitations and leg swelling.  Gastrointestinal: Negative for abdominal distention, abdominal pain, constipation, diarrhea, nausea and vomiting.  Endocrine: Negative for polydipsia, polyphagia and polyuria.  Genitourinary: Negative for dysuria, flank pain, hematuria and urgency.  Musculoskeletal: Negative for back pain, gait problem, myalgias and neck pain.  Skin: Negative for pallor, rash and wound.  Neurological: Negative for seizures, syncope, weakness, numbness and headaches.  Psychiatric/Behavioral: Negative.  Negative for confusion and dysphoric mood.    Objective:    BP (!) 156/62   Pulse 73   Wt 141 lb (64 kg)   SpO2 92%   BMI 22.76 kg/m   Wt Readings from Last 3 Encounters:  11/25/16 141 lb  (64 kg)  06/01/16 158 lb (71.7 kg)  02/26/16 155 lb (70.3 kg)    Physical Exam  Constitutional: He is oriented to person, place, and time. He appears well-developed and well-nourished. He is cooperative. No distress.  HENT:  Head: Normocephalic and atraumatic.  Eyes: EOM are normal.  Neck: Normal range of motion. Neck supple. No tracheal deviation present. No thyromegaly present.  Cardiovascular: Normal rate, S1 normal, S2 normal and normal heart sounds.  Exam reveals no gallop.   No murmur heard. Pulses:      Dorsalis pedis pulses are 0 on the right side, and 0 on the left side.       Posterior tibial pulses are 0 on the right side, and 0 on the left side.  Pulmonary/Chest: Breath sounds normal. No respiratory distress. He has no wheezes.  Abdominal: Soft. Bowel sounds are normal. He exhibits no distension. There is no tenderness. There is no guarding and no CVA tenderness.  Musculoskeletal: He exhibits no edema.       Right shoulder: He exhibits no swelling and no deformity.  Neurological: He is alert and oriented to person, place, and time. He has normal strength and normal reflexes. No cranial nerve deficit or sensory deficit. Gait normal.  Skin: Skin is warm and dry. No rash noted. No cyanosis. Nails show no clubbing.  Psychiatric: He has  a normal mood and affect. His speech is normal and behavior is normal. Cognition and memory are normal.   Recent Results (from the past 2160 hour(s))  Basic metabolic panel     Status: Abnormal   Collection Time: 09/30/16 12:00 AM  Result Value Ref Range   BUN 26 (A) 4 - 21   Creatinine 5.2 (A) 0.6 - 1.3  Hemoglobin A1c     Status: None   Collection Time: 09/30/16 12:00 AM  Result Value Ref Range   Hemoglobin A1C 7.1      05/28/2016 his A1c was 7% him a free T4 1.09, TSH 2.95  Assessment & Plan:   1.  Type 2 Diabetes mellitus complicated by coronary artery disease , Peripheral arterial disease , and  end-stage renal disease (HCC) Now on  hemodialysis  - patient remains at a high risk for more acute and chronic complications of diabetes which include CAD, CVA, CKD, retinopathy, and neuropathy. These are all discussed in detail with the patient.  Patient came with  controlled fasting glucose profile, and  recent A1c of  7.1%, stable from last visit .   Recent labs reviewed.   - I have re-counseled the patient on diet management   by adopting a carbohydrate restricted / protein rich  Diet.  - Suggestion is made for patient to avoid simple carbohydrates  from his diet including Cakes , Desserts, Ice Cream,  Soda (  diet and regular) , Sweet Tea , Candies,  Chips, Cookies, Artificial Sweeteners,   and "Sugar-free" Products .  This will help patient to have stable blood glucose profile and potentially avoid unintended  Weight gain.  - Patient is advised to stick to a routine mealtimes to eat 3 meals  a day and avoid unnecessary snacks ( to snack only to correct hypoglycemia).  - I have approached patient with the following individualized plan to manage diabetes and patient agrees.  - Due to recent hospitalization for pneumonia and herpes zoster, he has lost significant amount of weight. -  I will continue Lantus at 15 units every morning  associated with monitoring of glucose  daily before breakfast and at bedtime.  -Based on his current glucose profile he will  not need prandial insulin for now. -Patient is encouraged to call clinic for blood glucose levels less than 70 or above 300 mg /dl. - He did not afford Januvia hence discontinued. -Patient is not a candidate for  metformin,SGLT2 inhibitors due to CKD.  - He is not a suitable candidate for incretin therapy . - Patient specific target  for A1c; LDL, HDL, Triglycerides, and  Waist Circumference were discussed in detail.  2) BP/HTN: controlled. Continue current medications. 3) Lipids/HPL:  continue statins. 4)  Weight/Diet: CDE consult in progress, exercise, and  carbohydrates information provided.   5) hypothyroidism:  - I advised patient to continue levothyroxine 88 g to take every morning.  - We discussed about correct intake of levothyroxine, at fasting, with water, separated by at least 30 minutes from breakfast, and separated by more than 4 hours from calcium, iron, multivitamins, acid reflux medications (PPIs). -Patient is made aware of the fact that thyroid hormone replacement is needed for life, dose to be adjusted by periodic monitoring of thyroid function tests.  6) Chronic Care/Health Maintenance:  -Patient is  encouraged to continue to follow up with Ophthalmology, Podiatrist at least yearly or according to recommendations, and advised to  stay away from smoking. I have recommended yearly flu vaccine  and pneumonia vaccination at least every 5 years;  and  sleep for at least 7 hours a day.  7) Peripheral Arterial disease Pt with hx of diabetic foot ulcer and current calluses on bilateral calluses. He used diabetic shoes before, he will benefit from continued foot care including with diabetic shoes .  - 20 minutes of time was spent on the care of this patient , 50% of which was applied for counseling on diabetes complications and their preventions.  - I advised patient to maintain close follow up with Olen CordialHarris, Sydney M, MD for primary care needs.  Patient is asked to bring meter and  blood glucose logs during his next visit.   Follow up plan: -Return in about 3 months (around 02/25/2017) for follow up with pre-visit labs, meter, and logs.  Marquis LunchGebre Laylana Gerwig, MD Phone: [phone]  Fax: [phone]   11/25/2016, 3:01 PM

## 2016-12-01 ENCOUNTER — Other Ambulatory Visit: Payer: Self-pay | Admitting: "Endocrinology

## 2016-12-09 ENCOUNTER — Other Ambulatory Visit: Payer: Self-pay | Admitting: "Endocrinology

## 2017-01-08 ENCOUNTER — Other Ambulatory Visit: Payer: Self-pay | Admitting: "Endocrinology

## 2017-03-29 ENCOUNTER — Ambulatory Visit: Payer: Medicare Other | Admitting: "Endocrinology

## 2017-04-15 ENCOUNTER — Other Ambulatory Visit: Payer: Self-pay | Admitting: "Endocrinology

## 2017-07-06 ENCOUNTER — Other Ambulatory Visit: Payer: Self-pay | Admitting: "Endocrinology

## 2017-07-29 ENCOUNTER — Other Ambulatory Visit: Payer: Self-pay | Admitting: "Endocrinology

## 2017-10-21 ENCOUNTER — Other Ambulatory Visit: Payer: Self-pay | Admitting: "Endocrinology

## 2018-10-18 DEATH — deceased
